# Patient Record
Sex: Male | Born: 1980 | Race: White | Hispanic: No | Marital: Married | Smoking: Current every day smoker
Health system: Southern US, Community
[De-identification: ages and names within clinical notes are randomized; demographics above are authoritative.]

## PROBLEM LIST (undated history)

## (undated) DIAGNOSIS — K219 Gastro-esophageal reflux disease without esophagitis: Secondary | ICD-10-CM

## (undated) DIAGNOSIS — Q159 Congenital malformation of eye, unspecified: Secondary | ICD-10-CM

## (undated) DIAGNOSIS — Z87448 Personal history of other diseases of urinary system: Secondary | ICD-10-CM

## (undated) DIAGNOSIS — H547 Unspecified visual loss: Secondary | ICD-10-CM

## (undated) DIAGNOSIS — H903 Sensorineural hearing loss, bilateral: Secondary | ICD-10-CM

## (undated) DIAGNOSIS — H3552 Pigmentary retinal dystrophy: Secondary | ICD-10-CM

## (undated) DIAGNOSIS — I10 Essential (primary) hypertension: Secondary | ICD-10-CM

## (undated) DIAGNOSIS — Z973 Presence of spectacles and contact lenses: Secondary | ICD-10-CM

## (undated) DIAGNOSIS — Z8669 Personal history of other diseases of the nervous system and sense organs: Secondary | ICD-10-CM

## (undated) HISTORY — PX: CATARACT EXTRACTION, BILATERAL: SHX1313

## (undated) HISTORY — PX: CATARACT EXTRACTION: SUR2

## (undated) HISTORY — PX: HIATAL HERNIA REPAIR: SHX195

## (undated) HISTORY — PX: SINUS SURGERY: SHX187

## (undated) HISTORY — DX: Sensorineural hearing loss, bilateral: H90.3

## (undated) HISTORY — DX: Pigmentary retinal dystrophy: H35.52

---

## 2006-11-23 ENCOUNTER — Ambulatory Visit: Payer: Self-pay | Admitting: Emergency Medicine

## 2006-11-26 ENCOUNTER — Emergency Department: Payer: Self-pay | Admitting: Unknown Physician Specialty

## 2006-11-26 ENCOUNTER — Other Ambulatory Visit: Payer: Self-pay

## 2007-02-09 ENCOUNTER — Emergency Department: Payer: Self-pay | Admitting: Emergency Medicine

## 2007-04-16 ENCOUNTER — Ambulatory Visit: Payer: Self-pay | Admitting: Family Medicine

## 2010-07-28 ENCOUNTER — Ambulatory Visit: Payer: Self-pay | Admitting: Family Medicine

## 2010-11-29 ENCOUNTER — Ambulatory Visit: Payer: Self-pay | Admitting: Internal Medicine

## 2011-01-23 ENCOUNTER — Ambulatory Visit: Payer: Self-pay | Admitting: Ophthalmology

## 2011-02-26 ENCOUNTER — Ambulatory Visit: Payer: Self-pay | Admitting: Ophthalmology

## 2011-03-29 ENCOUNTER — Emergency Department: Payer: Self-pay | Admitting: *Deleted

## 2011-03-29 LAB — COMPREHENSIVE METABOLIC PANEL
Alkaline Phosphatase: 94 U/L (ref 50–136)
Anion Gap: 10 (ref 7–16)
BUN: 15 mg/dL (ref 7–18)
Bilirubin,Total: 0.5 mg/dL (ref 0.2–1.0)
Calcium, Total: 9.1 mg/dL (ref 8.5–10.1)
Chloride: 104 mmol/L (ref 98–107)
EGFR (African American): >60
Glucose: 92 mg/dL (ref 65–99)
Potassium: 3.9 mmol/L (ref 3.5–5.1)
SGOT(AST): 31 U/L (ref 15–37)
SGPT (ALT): 50 U/L
Total Protein: 7.7 g/dL (ref 6.4–8.2)

## 2011-03-29 LAB — URINALYSIS, COMPLETE
Bilirubin,UR: NEGATIVE
Blood: NEGATIVE
Glucose,UR: NEGATIVE mg/dL (ref 0–75)
Ketone: NEGATIVE
Leukocyte Esterase: NEGATIVE
Nitrite: NEGATIVE
Ph: 8 (ref 4.5–8.0)
Protein: NEGATIVE
Specific Gravity: 1.004 (ref 1.003–1.030)
Squamous Epithelial: NONE SEEN
WBC UR: NONE SEEN /HPF (ref 0–5)

## 2011-03-29 LAB — CBC
HCT: 48.4 % (ref 40.0–52.0)
MCH: 30.3 pg (ref 26.0–34.0)
MCV: 89 fL (ref 80–100)
Platelet: 209 10*3/uL (ref 150–440)
RBC: 5.46 10*6/uL (ref 4.40–5.90)

## 2011-04-17 ENCOUNTER — Ambulatory Visit: Payer: Self-pay | Admitting: Internal Medicine

## 2011-04-26 ENCOUNTER — Ambulatory Visit: Payer: Self-pay | Admitting: Internal Medicine

## 2011-08-30 ENCOUNTER — Ambulatory Visit: Payer: Self-pay | Admitting: Internal Medicine

## 2011-11-26 ENCOUNTER — Emergency Department: Payer: Self-pay | Admitting: Emergency Medicine

## 2012-01-30 ENCOUNTER — Inpatient Hospital Stay: Payer: Self-pay | Admitting: Psychiatry

## 2012-01-30 LAB — ETHANOL: Ethanol: <3 mg/dL

## 2012-01-30 LAB — COMPREHENSIVE METABOLIC PANEL
Albumin: 4.1 g/dL (ref 3.4–5.0)
Alkaline Phosphatase: 141 U/L — ABNORMAL HIGH (ref 50–136)
Calcium, Total: 9 mg/dL (ref 8.5–10.1)
Co2: 22 mmol/L (ref 21–32)
Creatinine: 0.92 mg/dL (ref 0.60–1.30)
EGFR (African American): >60
Glucose: 104 mg/dL — ABNORMAL HIGH (ref 65–99)
Osmolality: 274 (ref 275–301)
SGPT (ALT): 41 U/L (ref 12–78)
Sodium: 135 mmol/L — ABNORMAL LOW (ref 136–145)

## 2012-01-30 LAB — CBC
HCT: 49.1 % (ref 40.0–52.0)
HGB: 16.9 g/dL (ref 13.0–18.0)
MCH: 29.8 pg (ref 26.0–34.0)
MCV: 87 fL (ref 80–100)
RBC: 5.68 10*6/uL (ref 4.40–5.90)
RDW: 14 % (ref 11.5–14.5)
WBC: 9.8 10*3/uL (ref 3.8–10.6)

## 2012-01-30 LAB — TSH: Thyroid Stimulating Horm: 3.99 u[IU]/mL

## 2012-01-30 LAB — ACETAMINOPHEN LEVEL: Acetaminophen: <2 ug/mL

## 2012-01-31 LAB — URINALYSIS, COMPLETE
Bilirubin,UR: NEGATIVE
Blood: NEGATIVE
Leukocyte Esterase: NEGATIVE
Nitrite: NEGATIVE
Ph: 6 (ref 4.5–8.0)
Protein: NEGATIVE
RBC,UR: 1 /HPF (ref 0–5)
Squamous Epithelial: NONE SEEN
WBC UR: <1 /HPF (ref 0–5)

## 2012-01-31 LAB — DRUG SCREEN, URINE
Amphetamines, Ur Screen: NEGATIVE (ref ?–1000)
Benzodiazepine, Ur Scrn: POSITIVE (ref ?–200)
Cocaine Metabolite,Ur ~~LOC~~: NEGATIVE (ref ?–300)
MDMA (Ecstasy)Ur Screen: NEGATIVE (ref ?–500)
Opiate, Ur Screen: NEGATIVE (ref ?–300)
Phencyclidine (PCP) Ur S: NEGATIVE (ref ?–25)

## 2012-02-01 ENCOUNTER — Ambulatory Visit: Payer: Self-pay | Admitting: Internal Medicine

## 2012-02-24 ENCOUNTER — Ambulatory Visit: Payer: Self-pay | Admitting: Family Medicine

## 2013-04-24 IMAGING — CT CT ABD-PELV W/ CM
1 of 2 series · 16 of 32 positions shown, 20 images · IV contrast (isovue)
Comparison: none

REASON FOR EXAM: abd pain generalized
COMMENTS:

PROCEDURE:     JUMPER - JUMPER ABDOMEN / PELVIS W  - April 26, 2011 [DATE]
RESULT:     Comparison:  None
TECHNIQUE: Multiple axial images of the abdomen and pelvis were performed
from the lung bases to the pubic symphysis, with p.o. contrast and with 100
mL of Isovue 370 intravenous contrast.

[Series 2: soft tissue · axial · 0.87mm/px · z∈[-200,+245]mm · 16 of 99 slices shown, 20 images]
[im 5/99  soft-tissue]
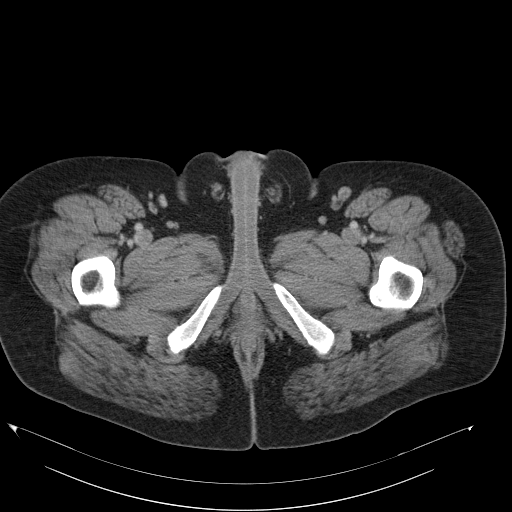
[im 5/99  bone]
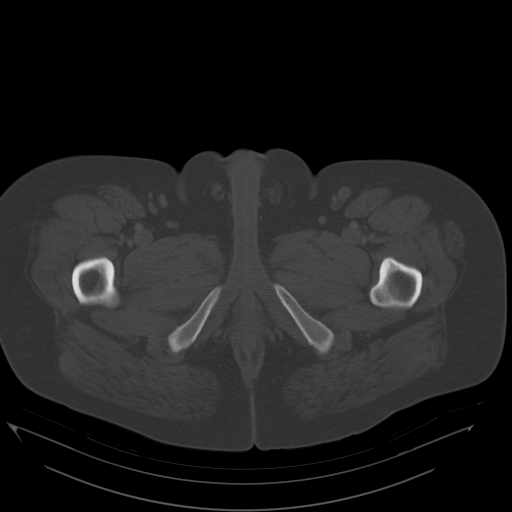
[im 13/99  soft-tissue]
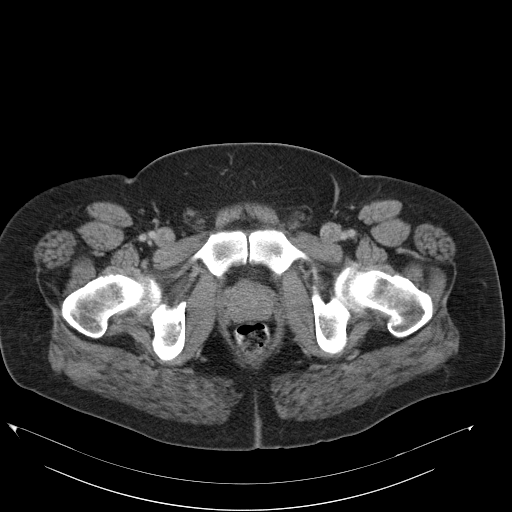
[im 21/99  soft-tissue]
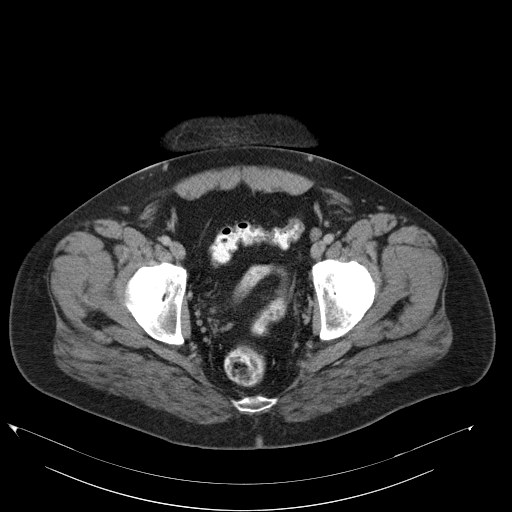
[im 25/99  soft-tissue]
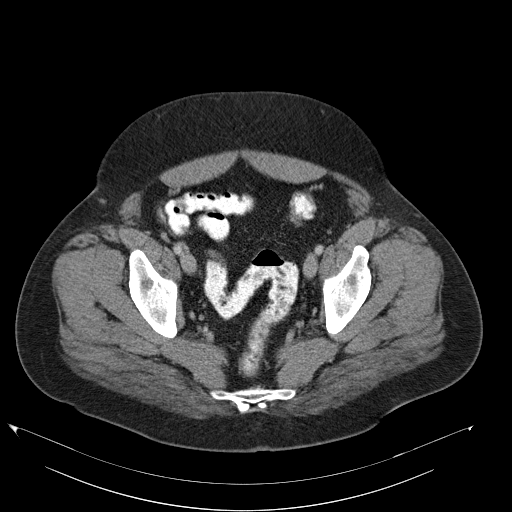
[im 33/99  soft-tissue]
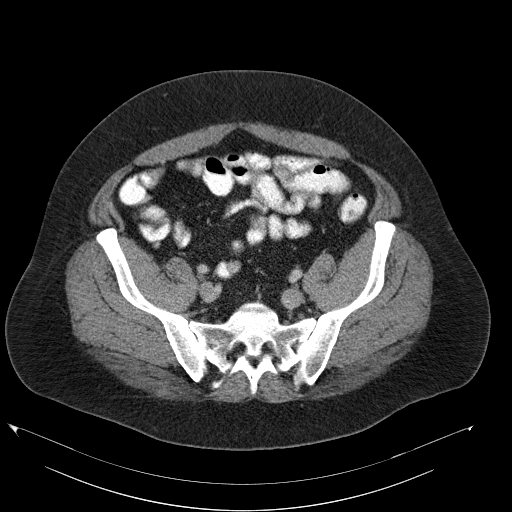
[im 41/99  soft-tissue]
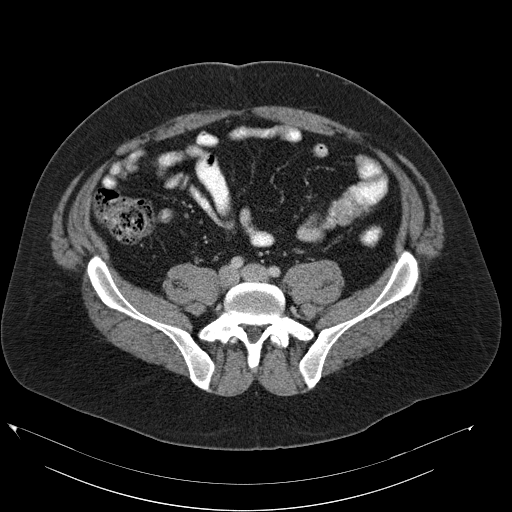
[im 45/99  soft-tissue]
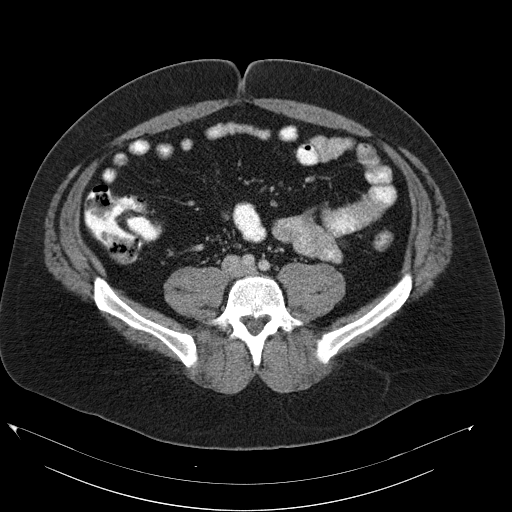
[im 54/99  soft-tissue]
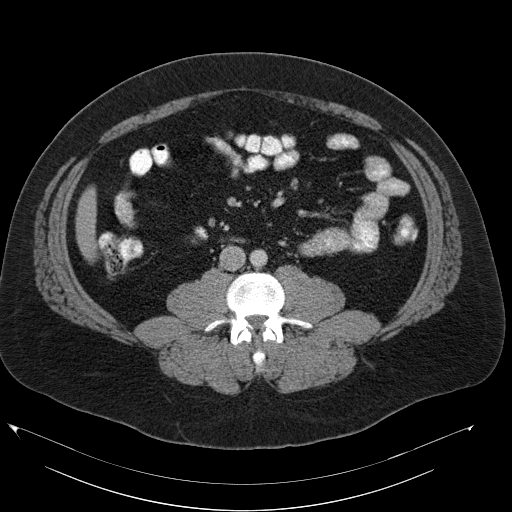
[im 58/99  soft-tissue]
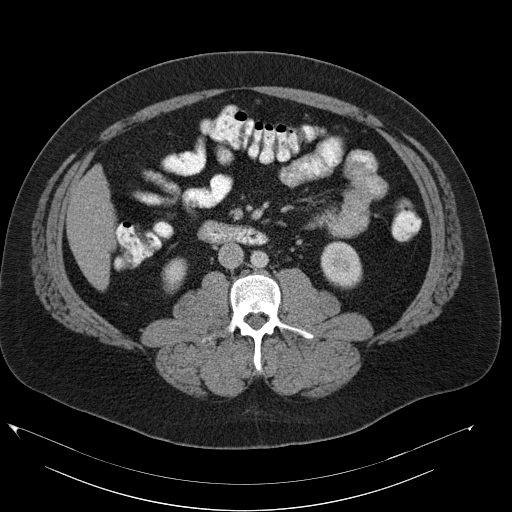
[im 58/99  bone]
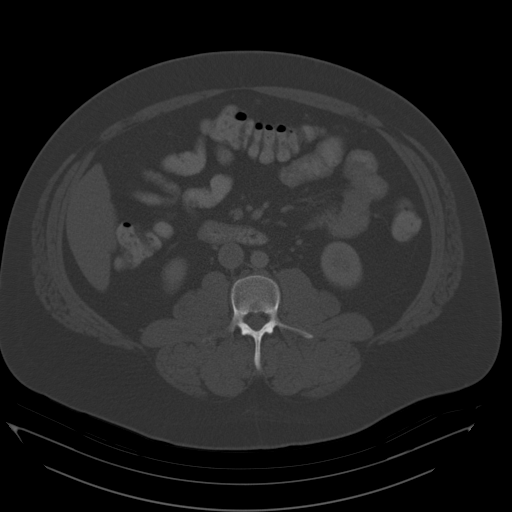
[im 66/99  soft-tissue]
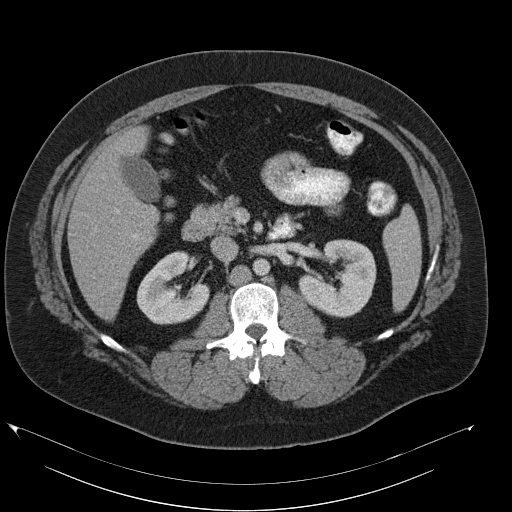
[im 74/99  soft-tissue]
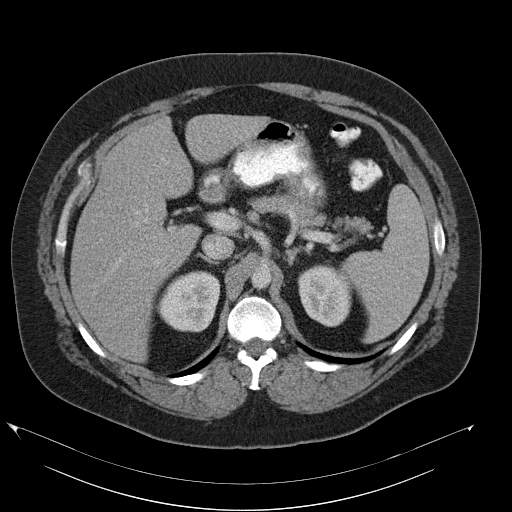
[im 78/99  soft-tissue]
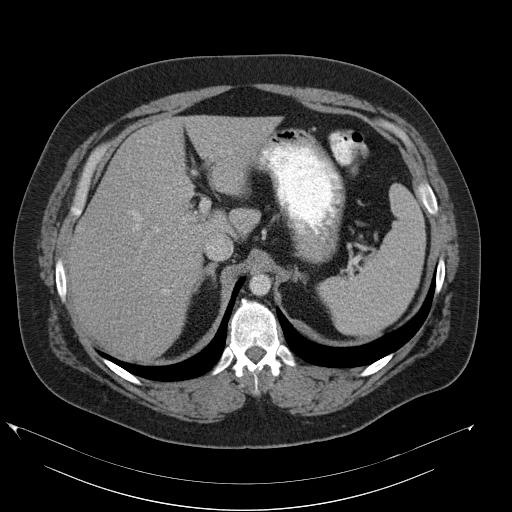
[im 82/99  lung]
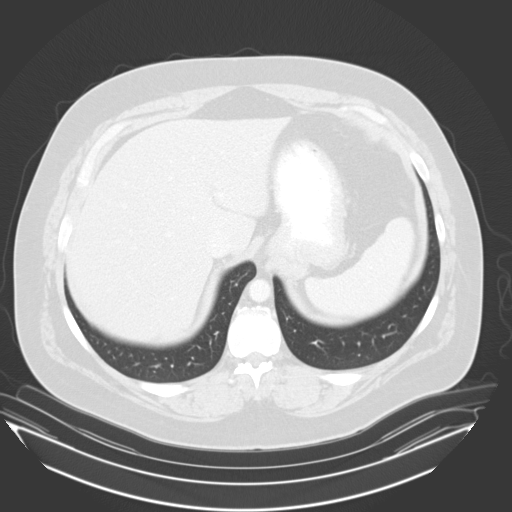
[im 86/99  soft-tissue]
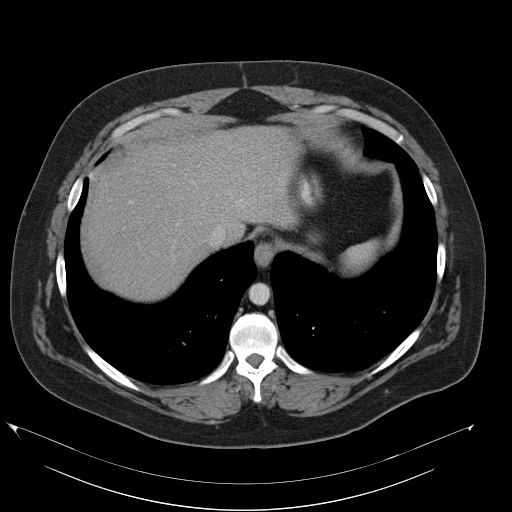
[im 86/99  lung]
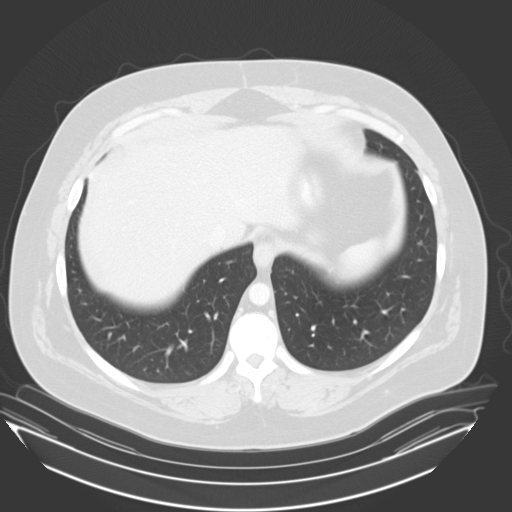
[im 90/99  lung]
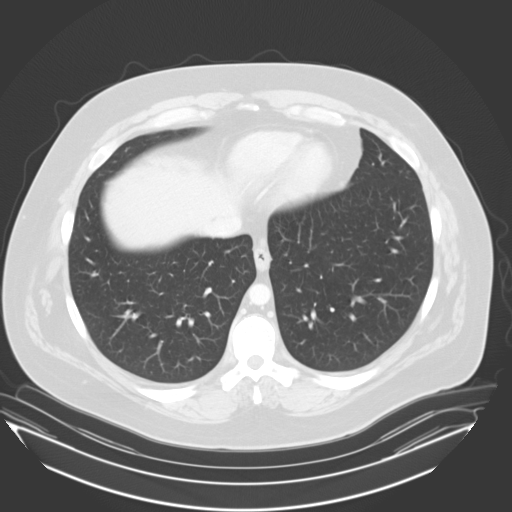
[im 94/99  soft-tissue]
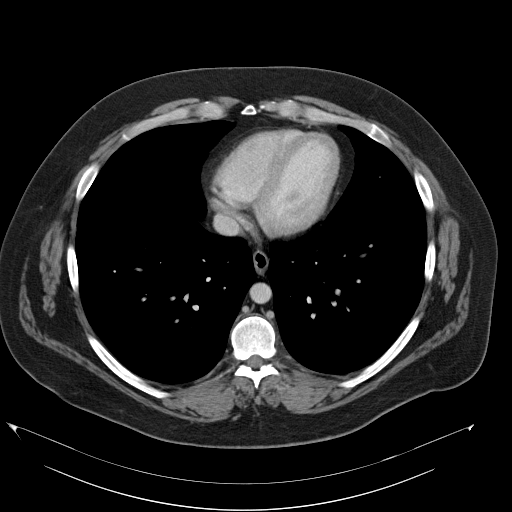
[im 94/99  lung]
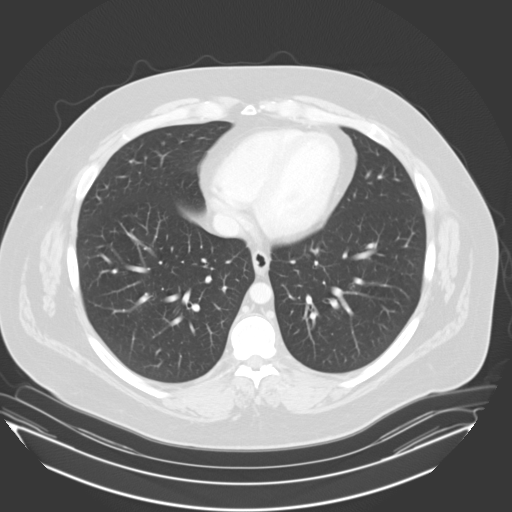

[16 of 32 positions shown; findings below may reference images not displayed]

FINDINGS: The liver, gallbladder, spleen, and pancreas are unremarkable. The adrenals
are unremarkable. The kidneys enhance normally.

The small and large bowel are normal in caliber. The appendix is normal.

No aggressive lytic or sclerotic osseous lesions are identified.
IMPRESSION: No acute findings in the abdomen or pelvis.

## 2013-11-24 IMAGING — US US EXTREM LOW VENOUS*L*
1 series · 14 of 24 positions shown · non-contrast
Comparison: none

REASON FOR EXAM: pain, edema, and ecchymosis 2nd to blunt trauma 5 days
ago
COMMENTS:   LMP: (Male)

[Series 1: us extrem low venous*left* · 14 of 35 slices shown]
[im 1/35]
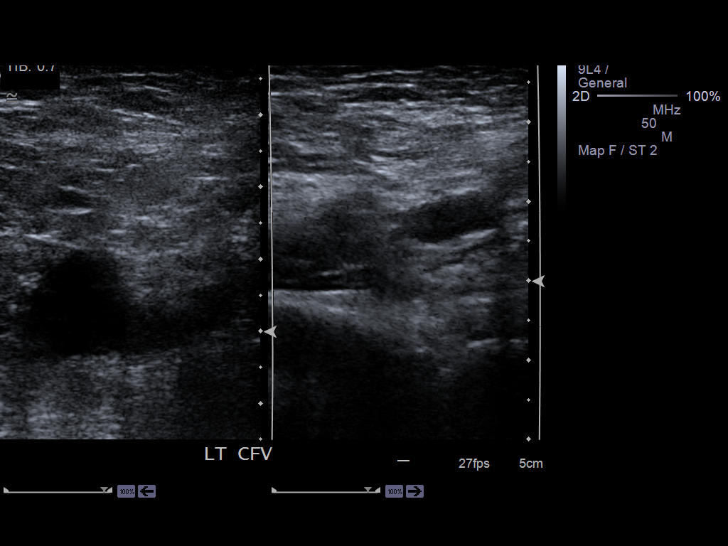
[im 3/35]
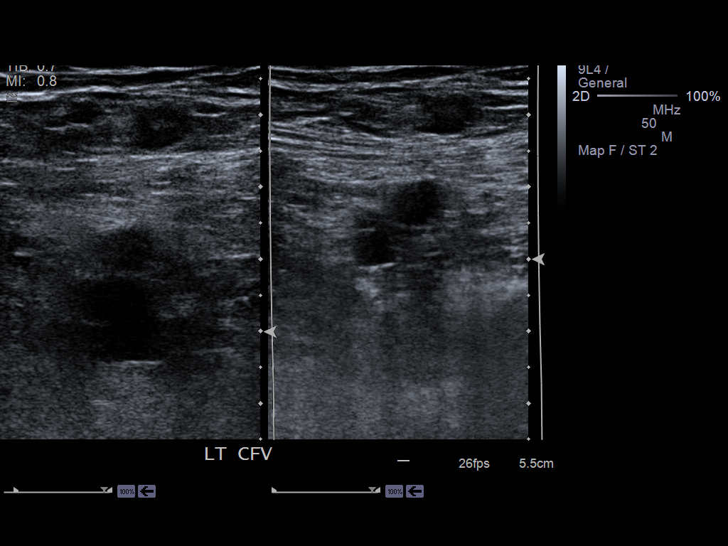
[im 6/35]
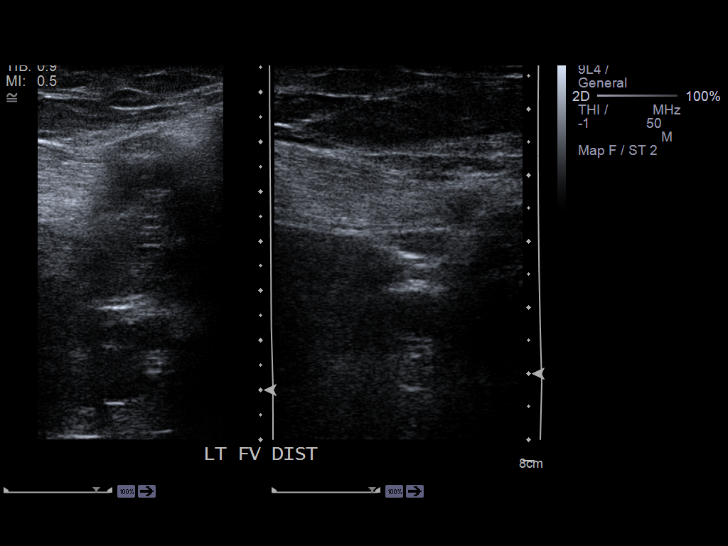
[im 9/35]
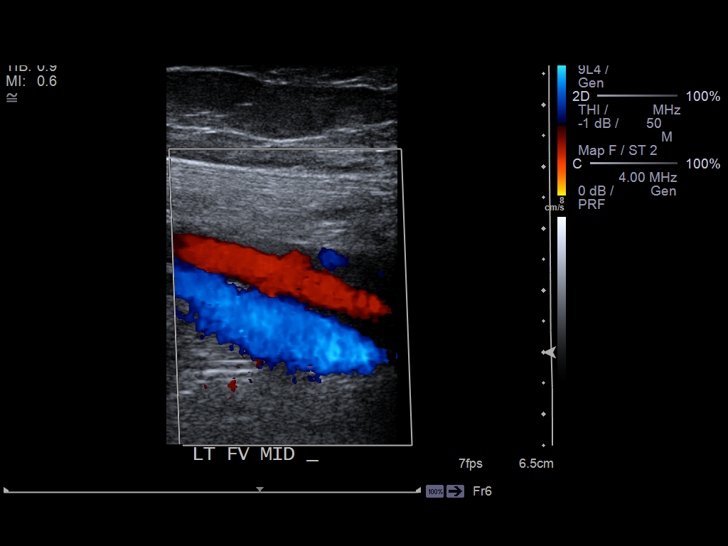
[im 11/35]
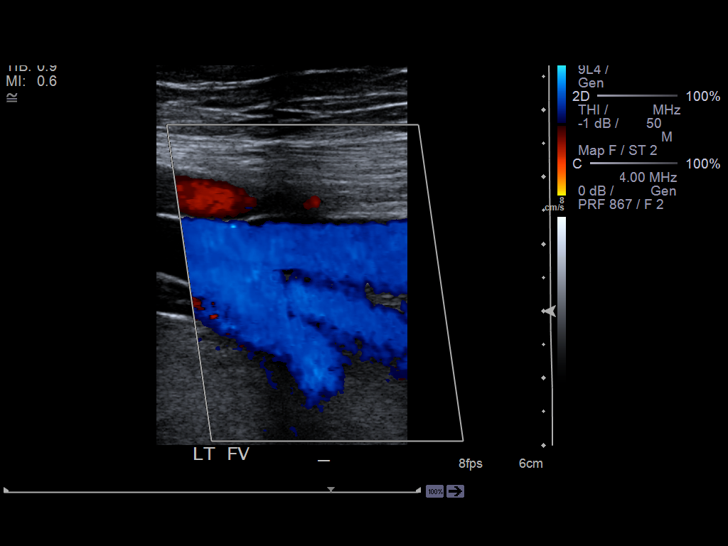
[im 14/35]
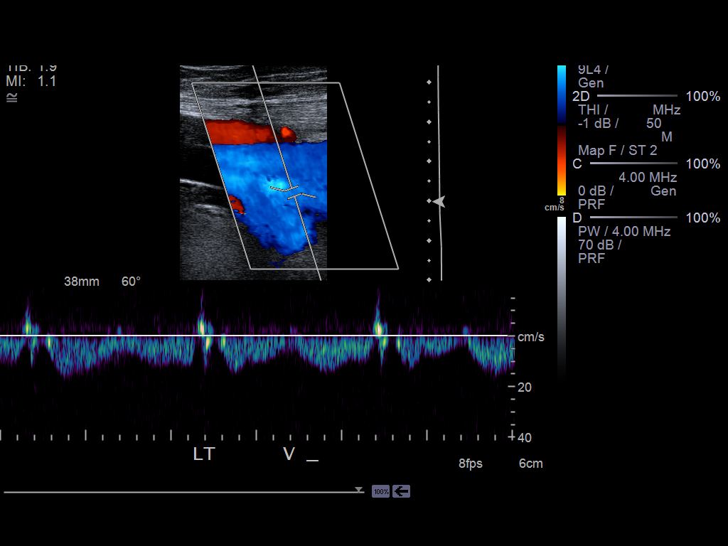
[im 17/35]
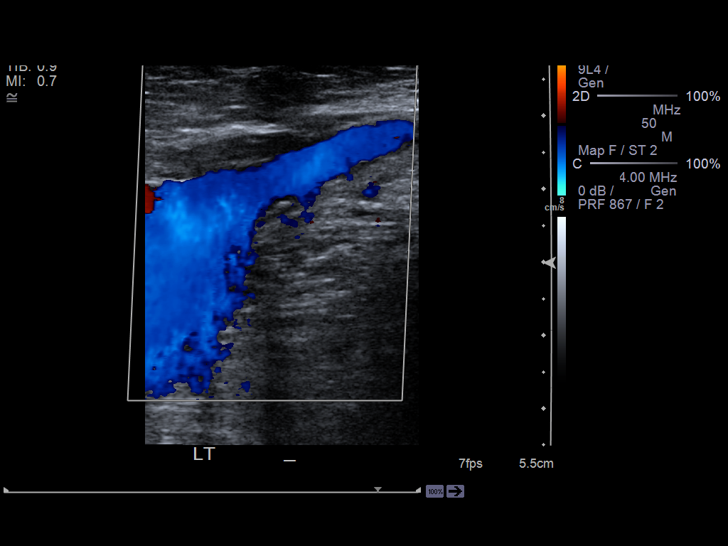
[im 18/35]
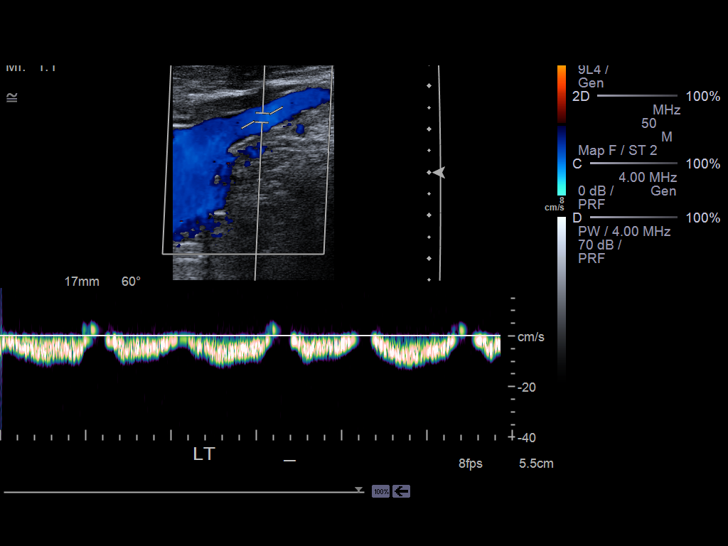
[im 21/35]
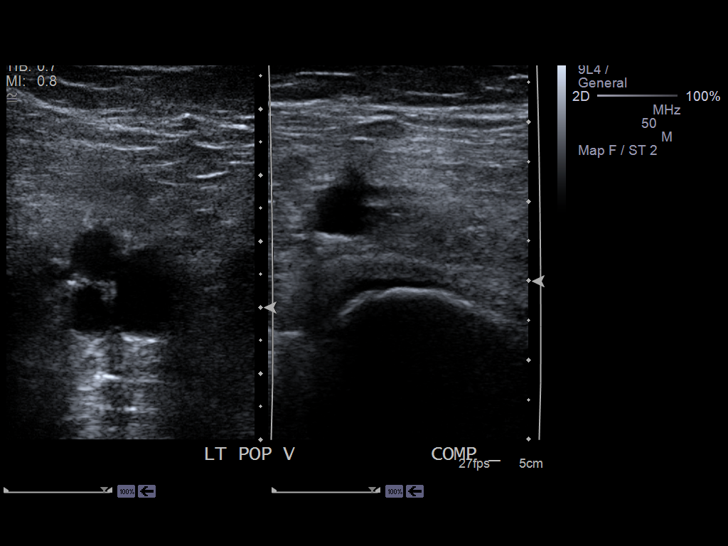
[im 24/35]
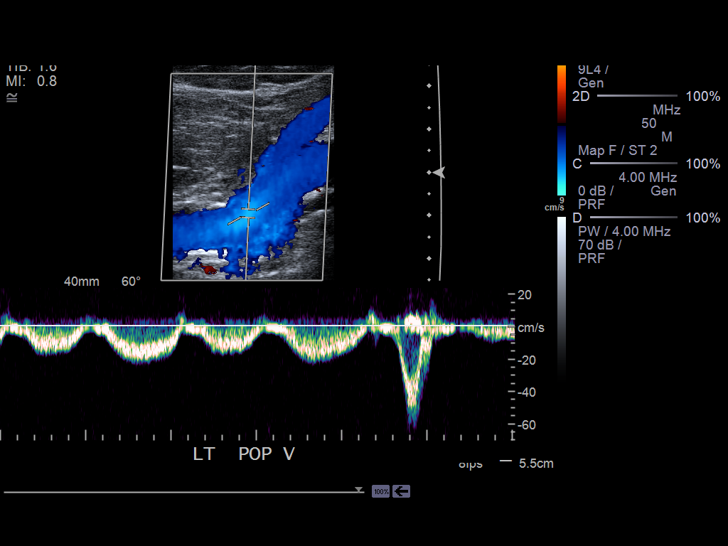
[im 27/35]
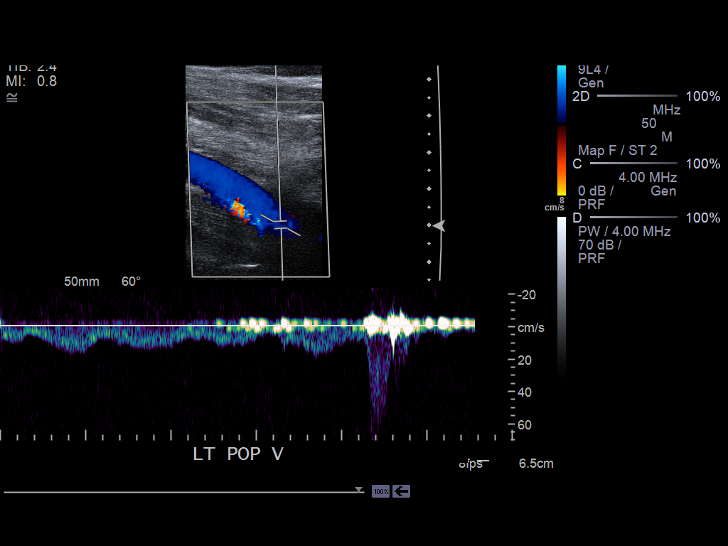
[im 29/35]
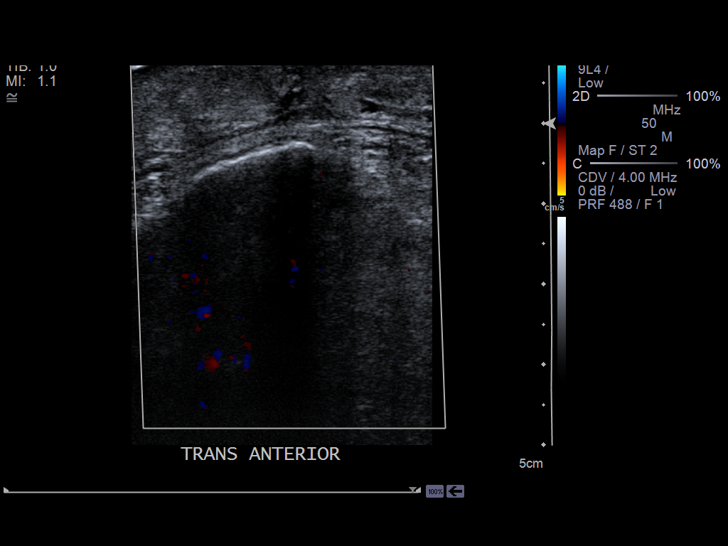
[im 32/35]
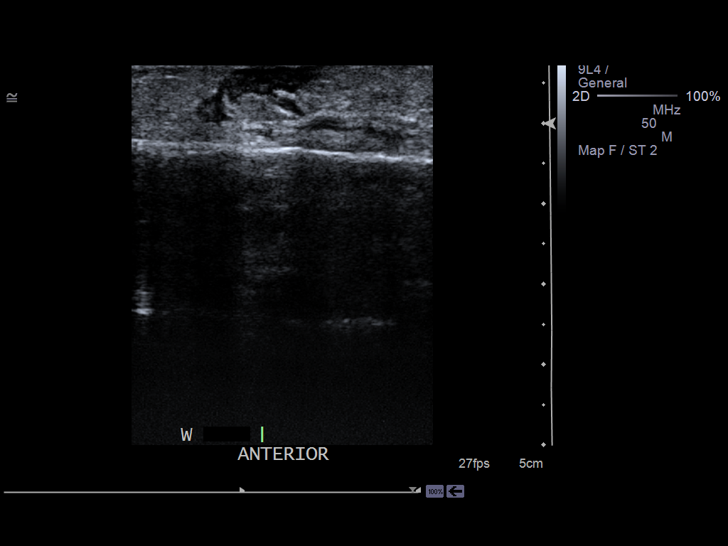
[im 35/35]
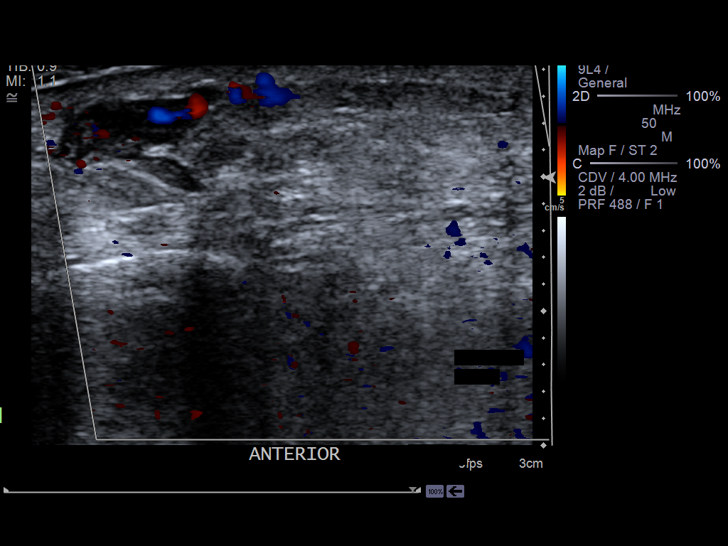

[14 of 24 positions shown; findings below may reference images not displayed]

PROCEDURE:     US  - US DOPPLER LOW EXTR LEFT  - November 26, 2011  [DATE]

RESULT:     Doppler interrogation of the deep venous system of the left leg
is performed from the common femoral vein through the popliteal vein.
Compression grayscale, color Doppler and spectral Doppler images are
utilized. The appearance is normal.

At the site of concern within the superficial soft tissues there is evidence
of superficial varicosities with some nonocclusive thrombus.
IMPRESSION: 1. No evidence of left lower extremity deep vein thrombosis.

[REDACTED]

## 2014-02-22 IMAGING — CR RIGHT HAND - COMPLETE 3+ VIEW
1 series · 3 of 3 positions shown · non-contrast
Comparison: none

REASON FOR EXAM: injury
COMMENTS:

PROCEDURE:     MDR - MDR HAND RT COMP W/OBLIQUES  - February 24, 2012  [DATE]
RESULT:     Comparison:  None

[Series 1: pa · 0.17mm/px · 3 of 3 slices shown]
[im 1/3]
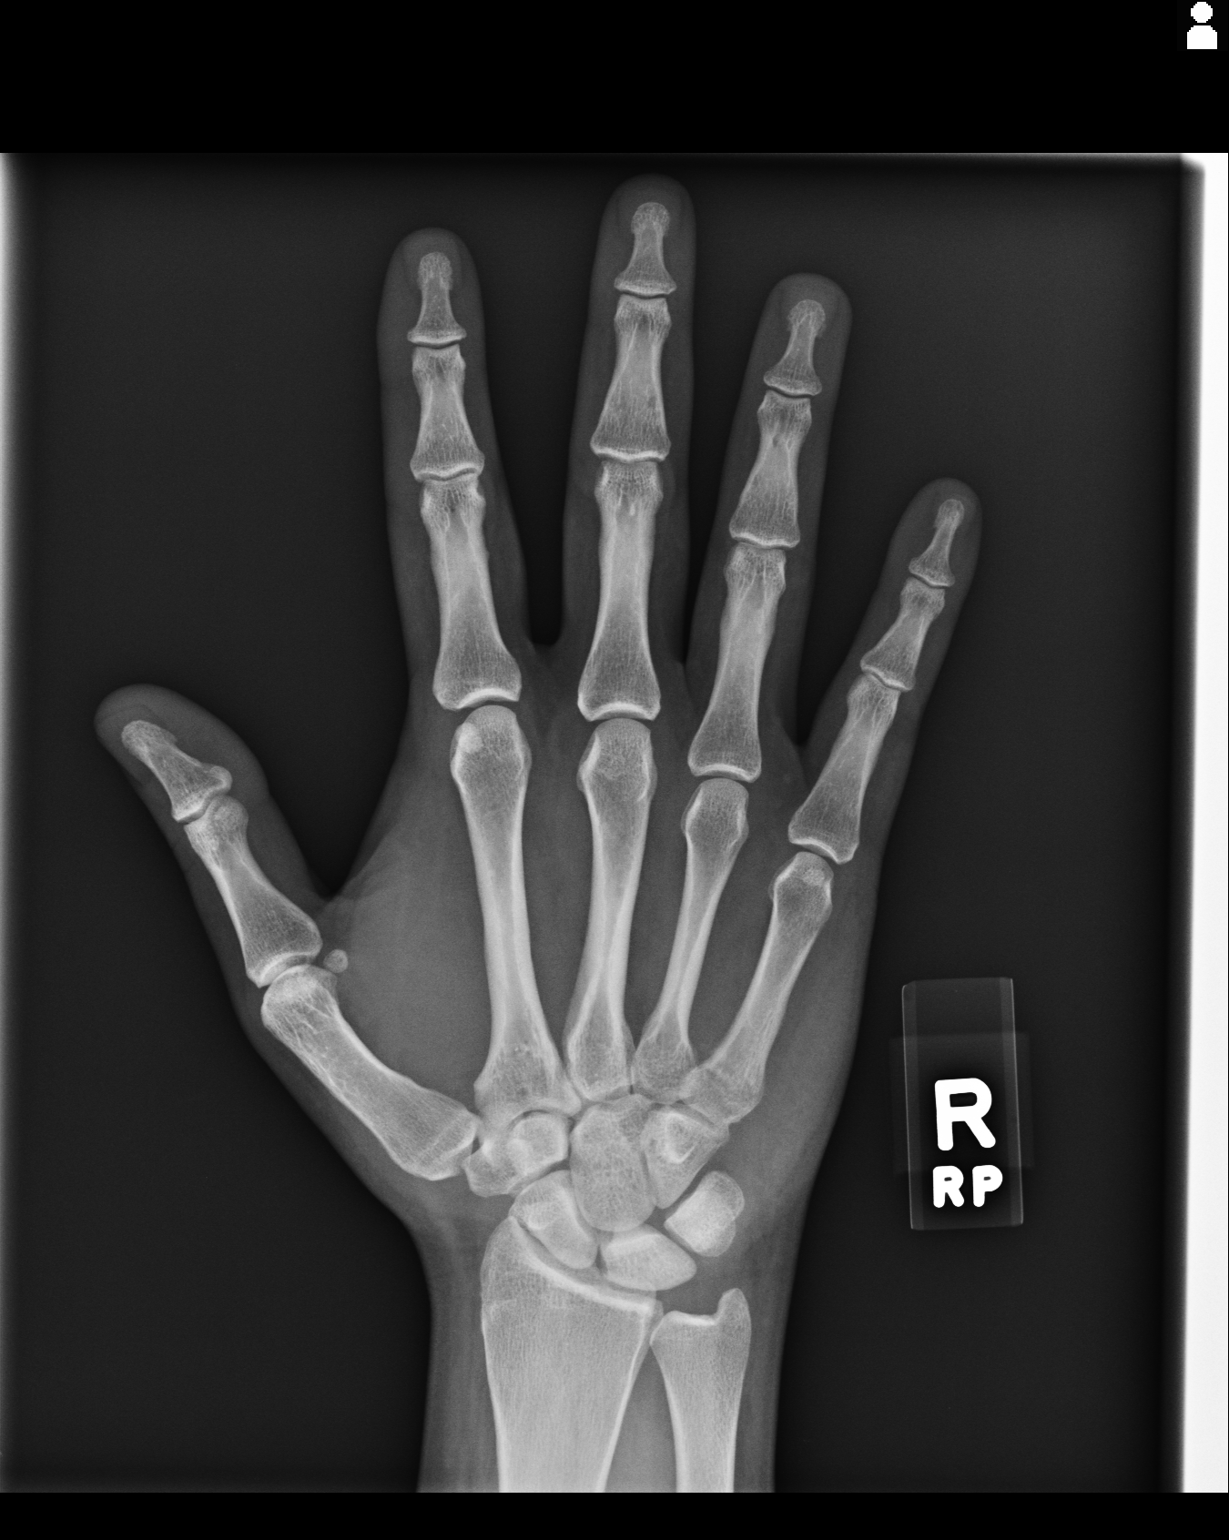
[im 2/3]
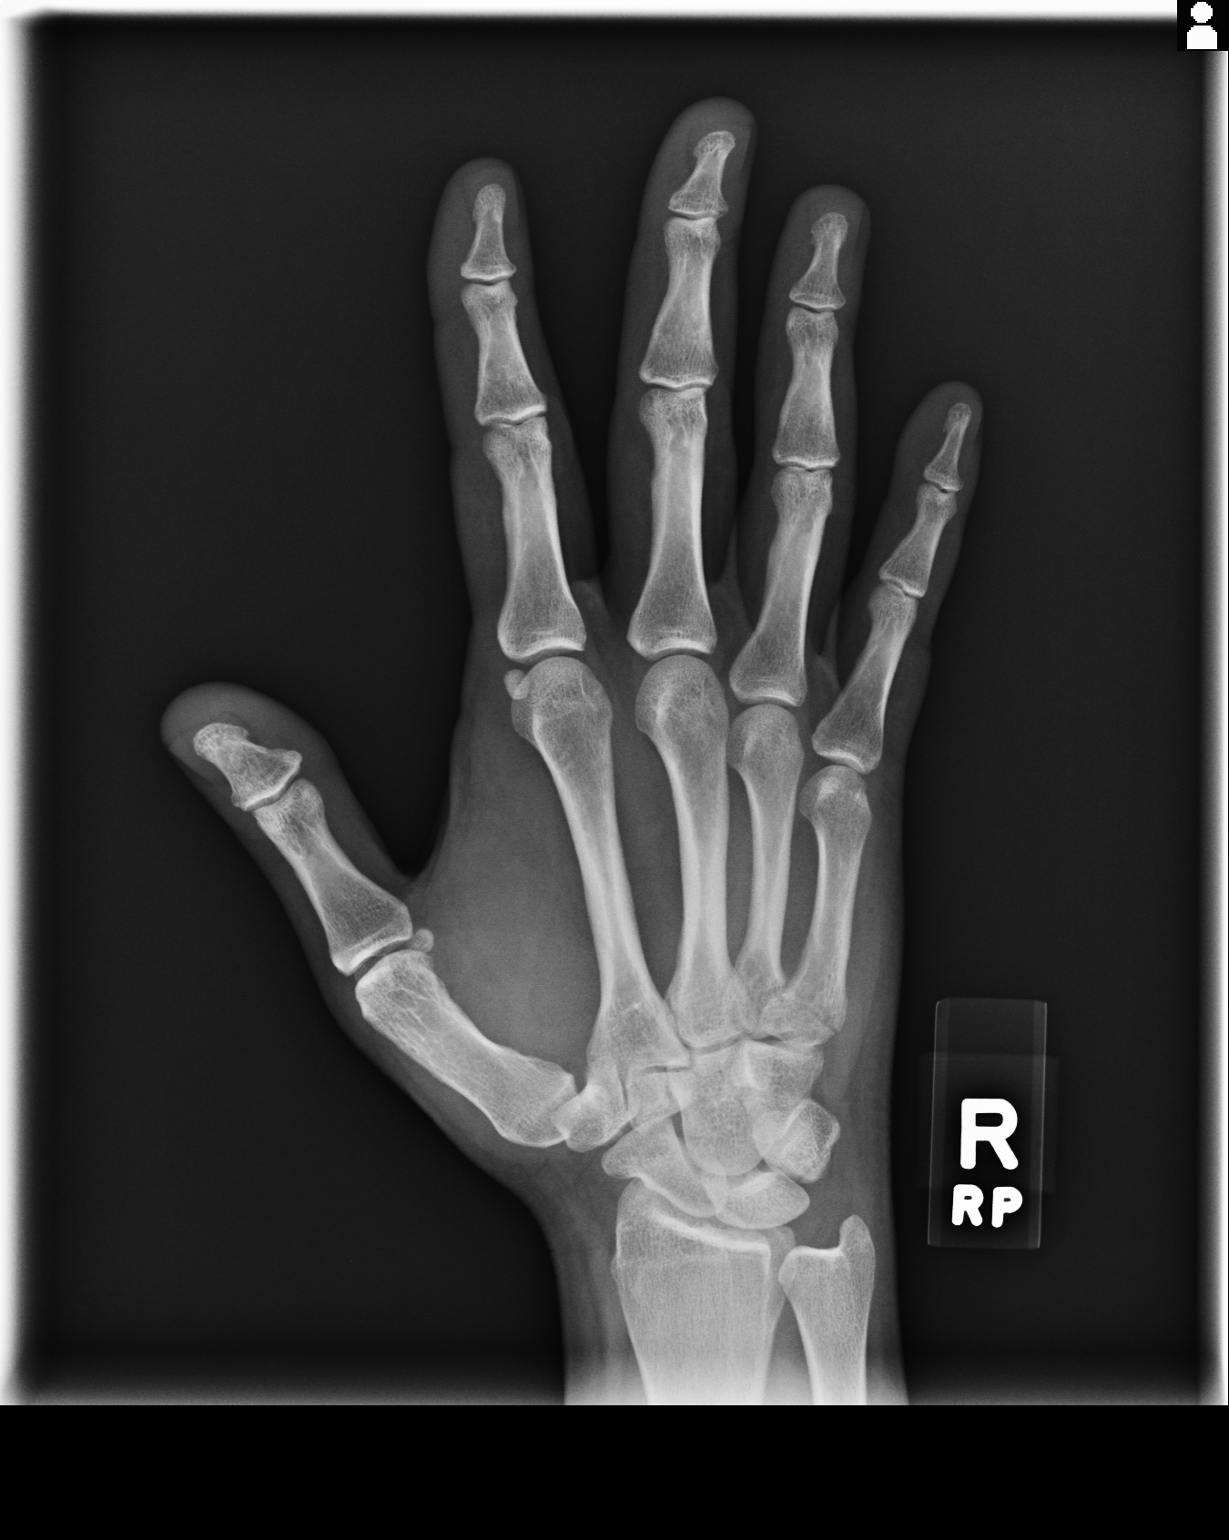
[im 3/3]
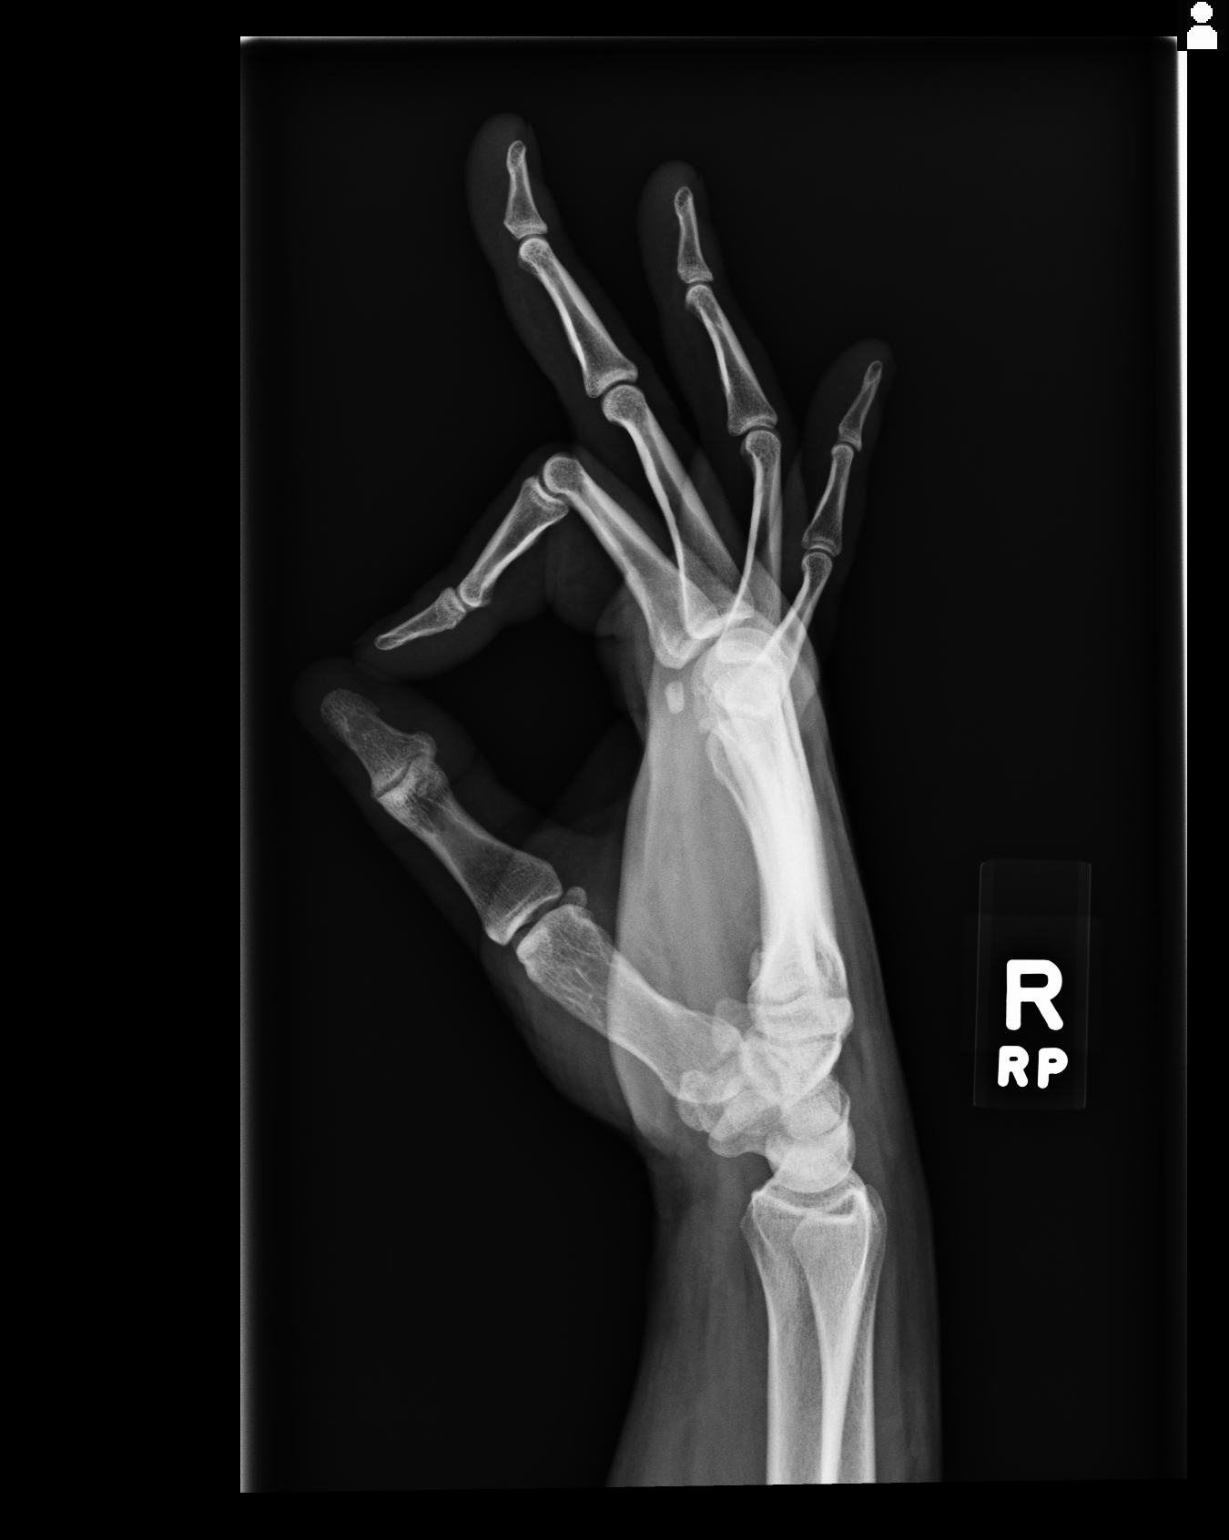

[3 of 3 positions shown; findings below may reference images not displayed]

FINDINGS: AP, oblique, and lateral views of the right hand demonstrates a nondisplaced
fracture of the base of the fifth metacarpal. There is normal bone
mineralization. There are no erosive changes. The joint spaces are
maintained. There is no soft tissue swelling.
IMPRESSION: Nondisplaced fracture at the base of the fifth metacarpal.

[REDACTED]
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
## 2014-05-31 NOTE — Discharge Summary (Signed)
PATIENT NAME:  Evan Reed, Firmin D MR#:  [id number] DATE OF BIRTH:  [birth year]  HOSPITAL COURSE:  See dictated history and physical for details.  The patient was just admitted to the hospital last night after presenting with complaints of recent suicidal statements. In the hospital, he was very aggressive and out of control in his behavior last night, throwing himself against the wall, punching a wall, making a great deal of noise. Apparently did not actually assault anyone else. He required some IM medication to get him to calm down. Today, he has been restless and demanding to see the doctor. He has not made any specific threats to anyone else and denies suicidal ideation. He does not show signs of psychosis. He was able to be more cooperative with medication today.   The patient gives a history of impulsivity and anger problems, but denies any acute suicidal or homicidal ideation. His wife was contacted and she also does not think that he is acutely suicidal or dangerous to anyone else and is requesting that he be discharged. The patient was evaluated and at this time, it does not seem like he is likely to benefit from inpatient treatment. He has been given psychoeducation about impulse control problems and possible medication options. He is not to change currently his dose of citalopram 40 mg a day. He will be given an [facility] or other mental health clinic locally. No medications prescribed at discharge.   LABORATORY AND DIAGNOSTIC DATA:  Urinalysis unremarkable. Drug screen positive for cannabis and benzodiazepines. TSH normal. Salicylates slightly high at 3.8. CBC normal. Alcohol undetected. Chemistry shows a slightly low sodium at 135, elevated BUN at 22, elevated glucose at 104.   DISCHARGE MEDICATIONS:  Citalopram 40 mg a day.  He used his own medication.   MENTAL STATUS EXAMINATION:  Casually dressed, neatly groomed man who looks his stated age. Eye contact normal.  Psychomotor activity normal. Speech normal rate, tone and volume.  Affect euthymic, a little bit blunted. Mood stated as being okay. Thoughts are lucid with no sign of loosening of associations or delusions. Denies current auditory or visual hallucinations. Denies any suicidal or homicidal ideation. Judgment and insight a little bit impaired, but not severely so. Alert and oriented x 4. There is some indication that he may have a slightly low below-average intelligence at baseline.   DISPOSITION:  Discharged back home with his family. Followup to be arranged with [facility]  DIAGNOSES, PRINCIPAL AND PRIMARY: AXIS I:  Impulse control disorder, not otherwise specified.   SECONDARY DIAGNOSES: AXIS I:  Cannabis abuse.  AXIS II:  Deferred.  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  AXIS IV:  Severe from financial and medical difficulties.  AXIS V:  Functioning at time of discharge 55.     ____________________________ Audery AmelJohn T. Demarquis Osley, MD jtc:ct D: 01/31/2012 15:24:00 ET T: 02/02/2012 14:40:52 ET JOB#: [id number]  cc: Audery AmelJohn T. Brynnlee Cumpian, MD, <Dictator> Audery AmelJOHN T Gorden Stthomas MD ELECTRONICALLY SIGNED 02/03/2012 13:08

## 2014-06-05 NOTE — Op Note (Signed)
PATIENT NAME:  [name], Evan D MR#:  Reed DATE OF BIRTH:  [birth year]  DATE OF PROCEDURE:  02/26/2011  PREOPERATIVE DIAGNOSIS: Visually significant cataract of the right eye.   POSTOPERATIVE DIAGNOSIS: Visually significant cataract of the right eye.   OPERATIVE PROCEDURE: Cataract extraction by phacoemulsification with implant of intraocular lens to the right eye.   SURGEON: Galen ManilaWilliam Breckon Reeves, MD  ANESTHESIA:  1. Managed anesthesia care.  2. 50-50 mixture of 0.75% bupivacaine and 4% Xylocaine given as a retrobulbar block.   COMPLICATIONS: None.   TECHNIQUE:  Irrigation/aspiration of the entire lens. No phacoemulsification was used.   DESCRIPTION OF PROCEDURE: The patient was examined and consented for this procedure in the preoperative holding area and then brought back to the Operating Room where the anesthesia team employed managed anesthesia care.  3.5 milliliters of the aforementioned mixture were placed in the right orbit on an Atkinson needle without complication. The right eye was then prepped and draped in the usual sterile ophthalmic fashion. A lid speculum was placed. The side-port blade was used to create a paracentesis and the anterior chamber was filled with viscoelastic. The keratome was used to create a near clear corneal incision. The continuous curvilinear capsulorrhexis was performed with a cystotome followed by the capsulorrhexis forceps. Hydrodissection and hydrodelineation were carried out with BSS on a blunt cannula. The lens was removed in an irrigation/aspiration technique. The remaining cortical material was removed with the irrigation-aspiration handpiece. The capsular bag was inflated with viscoelastic and the Alcon SN60WF 17.5-diopter lens, serial number [id number] was placed in the capsular bag without complication. The remaining viscoelastic was removed from the eye with the irrigation-aspiration handpiece. The wounds were hydrated. The anterior chamber  was flushed with Miostat. The eye was inflated to a physiologic pressure and the wounds were found to be watertight.   The eye was dressed with Vigamox followed by Maxitrol ointment and a protective shield was placed. The patient will follow-up with me in one day.   ____________________________ Jerilee FieldWilliam L. Sharlize Hoar, MD wlp:drc D: 02/26/2011 12:10:50 ET T: 02/26/2011 12:21:42 ET JOB#: [id number]  cc: Olivine Hiers L. Recie Cirrincione, MD, <Dictator> Jerilee FieldWILLIAM L Zachary Lovins MD ELECTRONICALLY SIGNED 03/26/2011 12:47

## 2015-09-11 ENCOUNTER — Encounter: Payer: Self-pay | Admitting: Emergency Medicine

## 2015-09-11 ENCOUNTER — Emergency Department
Admission: EM | Admit: 2015-09-11 | Discharge: 2015-09-11 | Disposition: A | Discharge status: Home / Self Care | Payer: Medicare Other | Attending: Emergency Medicine | Admitting: Emergency Medicine

## 2015-09-11 DIAGNOSIS — K0889 Other specified disorders of teeth and supporting structures: Secondary | ICD-10-CM

## 2015-09-11 DIAGNOSIS — K029 Dental caries, unspecified: Secondary | ICD-10-CM | POA: Insufficient documentation

## 2015-09-11 MED ORDER — AMOXICILLIN 500 MG PO TABS: 500.0000 mg | ORAL_TABLET | Freq: Two times a day (BID) | ORAL | 0 refills | Status: DC

## 2015-09-11 MED ORDER — OXYCODONE-ACETAMINOPHEN 5-325 MG PO TABS: 1.0000 | ORAL_TABLET | ORAL | 0 refills | Status: DC | PRN

## 2015-09-11 MED ORDER — MELOXICAM 15 MG PO TABS: 15.0000 mg | ORAL_TABLET | Freq: Every day | ORAL | 0 refills | Status: DC

## 2015-09-11 MED ORDER — OXYCODONE-ACETAMINOPHEN 5-325 MG PO TABS
ORAL_TABLET | ORAL | Status: AC
  Filled 2015-09-11: qty 1

## 2015-09-11 MED ORDER — OXYCODONE-ACETAMINOPHEN 5-325 MG PO TABS
1.0000 | ORAL_TABLET | Freq: Once | ORAL | Status: AC
  Administered 2015-09-11: 1 via ORAL

## 2015-09-11 NOTE — Discharge Instructions (Signed)
OPTIONS FOR DENTAL FOLLOW UP CARE ° °[place] Department of Health and Human Services - Local Safety Net Dental Clinics °http://www.ncdhhs.gov/dph/oralhealth/services/safetynetclinics.htm °  °[facility] (336-562-3123) ° °[facility] (919-933-9087) ° °[facility] (919-663-1744 ext [phone]) ° °[facility] (336-570-6415) ° °[facility] (919-968-2025) °This clinic caters to the indigent population and is on a lottery system. °Location: °[facility], [address], [place] °Clinic Hours: °Wednesdays from 6pm - 9pm, patients seen by a lottery system. °For dates, call or go to www.med.[facility].edu/[facility]/patients/Dental-[facility] °Services: °Cleanings, fillings and simple extractions. °Payment Options: °DENTAL WORK IS FREE OF CHARGE. Bring proof of income or support. °Best way to get seen: °Arrive at 5:15 pm - this is a lottery, NOT first come/first serve, so arriving earlier will not increase your chances of being seen. °  °  °[facility] °919-537-3737 °Select option 1 for emergencies °  °Location: °[facility], [address], [place] °Clinic Hours: °No walk-ins accepted - call the day before to schedule an appointment. °Check in times are 9:30 am and 1:30 pm. °Services: °Simple extractions, temporary fillings, pulpectomy/pulp debridement, uncomplicated abscess drainage. °Payment Options: °PAYMENT IS DUE AT THE TIME OF SERVICE.  Fee is usually $100-200, additional surgical procedures (e.g. abscess drainage) may be extra. °Cash, checks, Visa/MasterCard accepted.  Can file Medicaid if patient is covered for dental - patient should call case worker to check. °No discount for [facility] patients. °Best way to get seen: °MUST call the day before and get onto the schedule. Can usually be seen the next 1-2 days. No walk-ins accepted. °  °  °[facility] °[phone] °   °Location: °[facility], [address], [place] °Clinic Hours: °M, W, Th, F 8am or 1:30pm, Tues 9a or 1:30 - first come/first served. °Services: °Simple extractions, temporary fillings, uncomplicated abscess drainage.  You do not need to be an [place] resident. °Payment Options: °PAYMENT IS DUE AT THE TIME OF SERVICE. °Dental insurance, otherwise sliding scale - bring proof of income or support. °Depending on income and treatment needed, cost is usually $50-200. °Best way to get seen: °Arrive early as it is first come/first served. °  °  °[facility] °919-542-1641 °  °Location: °[address] °Clinic Hours: °Mon-Thu 8a-5p °Services: °Most basic dental services including extractions and fillings. °Payment Options: °PAYMENT IS DUE AT THE TIME OF SERVICE. °Sliding scale, up to 50% off - bring proof if income or support. °Medicaid with dental option accepted. °Best way to get seen: °Call to schedule an appointment, can usually be seen within 2 weeks OR they will try to see walk-ins - show up at 8a or 2p (you may have to wait). °  °  °[facility] °919-245-2435 °[place] RESIDENTS ONLY °  °Location: °[facility], [address], [place], [place] [address] °Clinic Hours: By appointment only. °Monday - Thursday 8am-5pm, Friday 8am-12pm °Services: Cleanings, fillings, extractions. °Payment Options: °PAYMENT IS DUE AT THE TIME OF SERVICE. °Cash, Visa or MasterCard. Sliding scale - $30 minimum per service. °Best way to get seen: °Come in to office, complete packet and make an appointment - need proof of income °or support monies for each household member and proof of [place] residence. °Usually takes about a month to get in. °  °  °[facility] °[phone] °  °Location: °[address]   [place] °Clinic Hours: Walk-in Urgent Care Dental Services are offered Monday-Friday  mornings only. °The numbers of emergencies accepted daily is limited to the number of °providers available. °Maximum 15 - Mondays, Wednesdays & Thursdays °Maximum 10 - Tuesdays & Fridays °Services: °You do not need to be a [place] resident to be seen for a dental emergency. °Emergencies are defined as pain, swelling, abnormal bleeding, or dental trauma. Walkins will receive x-rays if needed. °NOTE: Dental cleaning is not an emergency. °Payment Options: °PAYMENT IS DUE AT THE TIME OF SERVICE. °Minimum co-pay is $40.00 for uninsured patients. °Minimum co-pay is $3.00 for Medicaid with dental coverage. °Dental Insurance is accepted and must be presented at time of visit. °Medicare does not cover dental. °Forms of payment: Cash, credit card, checks. °Best way to get seen: °If not previously registered with the clinic, walk-in dental registration begins at 7:15 am and is on a first come/first serve basis. °If previously registered with the clinic, call to make an appointment. °  °  °[facility] °919-776-4359 °[place] RESIDENTS ONLY °  °Location: °[address], [place], [place] °Clinic Hours: °Mon-Thu 10a-2p °Services: Extractions only! °Payment Options: °FREE (donations accepted) - bring proof of income or support °Best way to get seen: °Call and schedule an appointment OR come at 8am on the 1st Monday of every month (except for holidays) when it is first come/first served. °  °  °[facility] °[phone] °  °Location: °[address],  °Clinic Hours: °Friday mornings °Services, Payment Options, Best way to get seen: °Call for info °

## 2015-09-11 NOTE — ED Provider Notes (Signed)
[facility] Emergency Department Provider Note  ____________________________________________  Time seen: Approximately 2:42 PM  I have reviewed the triage vital signs and the nursing notes.   HISTORY  Chief Complaint Dental Pain  Dictation was completed a month after patient visit. Unsure of which is tooth actually is hurting at the time of this dictation.  HPI Evan Reed is a 35 y.o. male who presents for evaluation of dental pain. Patient states that pain has been going on for some time and desires referral to local dentist for evaluation.   History reviewed. No pertinent past medical history.  There are no active problems to display for this patient.   History reviewed. No pertinent surgical history.  Prior to Admission medications   Medication Sig Start Date End Date Taking? Authorizing Provider  amoxicillin (AMOXIL) 500 MG tablet Take 1 tablet (500 mg total) by mouth 2 (two) times daily. 09/11/15   Charmayne Sheer Beers, PA-C  meloxicam (MOBIC) 15 MG tablet Take 1 tablet (15 mg total) by mouth daily. 09/11/15   Evangeline Dakin, PA-C  oxyCODONE-acetaminophen (ROXICET) 5-325 MG tablet Take 1-2 tablets by mouth every 4 (four) hours as needed for severe pain. 09/11/15   Evangeline Dakin, PA-C    Allergies Review of patient's allergies indicates no known allergies.  No family history on file.  Social History Social History  Substance Use Topics  . Smoking status: Never Smoker  . Smokeless tobacco: Never Used  . Alcohol use Not on file    Review of Systems Constitutional: No fever/chills ENT: Positive for dental pain. Cardiovascular: Denies chest pain. Respiratory: Denies shortness of breath. Neurological: Negative for headaches, focal weakness or numbness.  10-point ROS otherwise negative.  ____________________________________________   PHYSICAL EXAM:  VITAL SIGNS: ED Triage Vitals  Enc Vitals Group     BP 09/11/15 1429 (!)  144/90     Pulse Rate 09/11/15 1429 74     Resp 09/11/15 1429 18     Temp 09/11/15 1429 98.1 F (36.7 C)     Temp Source 09/11/15 1429 Oral     SpO2 09/11/15 1429 97 %     Weight 09/11/15 1429 200 lb (90.7 kg)     Height --      Head Circumference --      Peak Flow --      Pain Score 09/11/15 1433 6     Pain Loc --      Pain Edu? --      Excl. in GC? --     Constitutional: Alert and oriented. Well appearing and in no acute distress. Mouth/Throat: Mucous membranes are moist.  Oropharynx non-erythematous.Obvious dental caries. Neck: No stridor.   Cardiovascular: Normal rate, regular rhythm. Grossly normal heart sounds.  Good peripheral circulation. Respiratory: Normal respiratory effort.  No retractions. Lungs CTAB. Skin:  Skin is warm, dry and intact. No rash noted. Psychiatric: Mood and affect are normal. Speech and behavior are normal.  ____________________________________________   LABS (all labs ordered are listed, but only abnormal results are displayed)  Labs Reviewed - No data to display ____________________________________________  EKG   ____________________________________________  RADIOLOGY   ____________________________________________   PROCEDURES  Procedure(s) performed: None  Critical Care performed: No  ____________________________________________   INITIAL IMPRESSION / ASSESSMENT AND PLAN / ED COURSE  Pertinent labs & imaging results that were available during my care of the patient were reviewed by me and considered in my medical decision making (see chart for details).  Acute  dental pain with dental caries. Rx given for amoxicillin 500 mg 3 times a day and oxycodone 5/325. List of local dental providers given to the patient upon discharge. Patient follow-up with PCP or return to ER with any worsening symptomology.  Clinical Course    ____________________________________________   FINAL CLINICAL IMPRESSION(S) / ED DIAGNOSES  Final  diagnoses:  Pain, dental  Dental caries     This chart was dictated using voice recognition software/Dragon. Despite best efforts to proofread, errors can occur which can change the meaning. Any change was purely unintentional.    Evangeline Dakin, PA-C 10/04/15 1646    Sharyn Creamer, MD 10/08/15 [phone]

## 2015-09-11 NOTE — ED Triage Notes (Signed)
Toothache x 1 month.  NAD.  (Pt is blind.)

## 2015-09-11 NOTE — ED Provider Notes (Signed)
[facility] Emergency Department Provider Note  ____________________________________________  Time seen: Approximately 3:10 PM  I have reviewed the triage vital signs and the nursing notes.   HISTORY  Chief Complaint Dental Pain    HPI Evan Reed is a 35 y.o. male presents for evaluation of dental pain. Patient states that he's got broken lower left tooth.   History reviewed. No pertinent past medical history.  There are no active problems to display for this patient.   History reviewed. No pertinent surgical history.  Prior to Admission medications   Medication Sig Start Date End Date Taking? Authorizing Provider  amoxicillin (AMOXIL) 500 MG tablet Take 1 tablet (500 mg total) by mouth 2 (two) times daily. 09/11/15   Charmayne Sheer Aldora Perman, PA-C  meloxicam (MOBIC) 15 MG tablet Take 1 tablet (15 mg total) by mouth daily. 09/11/15   Evangeline Dakin, PA-C  oxyCODONE-acetaminophen (ROXICET) 5-325 MG tablet Take 1-2 tablets by mouth every 4 (four) hours as needed for severe pain. 09/11/15   Evangeline Dakin, PA-C    Allergies Review of patient's allergies indicates no known allergies.  No family history on file.  Social History Social History  Substance Use Topics  . Smoking status: Never Smoker  . Smokeless tobacco: Never Used  . Alcohol use Not on file    Review of Systems Constitutional: No fever/chills Eyes: No visual changes. ENT: No sore throat.Positive for dental pain. Cardiovascular: Denies chest pain. Respiratory: Denies shortness of breath. Musculoskeletal: Negative for back pain. Skin: Negative for rash. Neurological: Negative for headaches, focal weakness or numbness.  10-point ROS otherwise negative.  ____________________________________________   PHYSICAL EXAM:  VITAL SIGNS: ED Triage Vitals  Enc Vitals Group     BP 09/11/15 1429 (!) 144/90     Pulse Rate 09/11/15 1429 74     Resp 09/11/15 1429 18     Temp 09/11/15  1429 98.1 F (36.7 C)     Temp Source 09/11/15 1429 Oral     SpO2 09/11/15 1429 97 %     Weight 09/11/15 1429 200 lb (90.7 kg)     Height --      Head Circumference --      Peak Flow --      Pain Score 09/11/15 1433 6     Pain Loc --      Pain Edu? --      Excl. in GC? --     Constitutional: Alert and oriented. Well appearing and in no acute distress. Mouth/Throat: Mucous membranes are moist.  Oropharynx non-erythematous. Obvious dental caries with broken fractured left lower molar. Neck: No stridor.   Cardiovascular: Normal rate, regular rhythm. Grossly normal heart sounds.  Good peripheral circulation. Respiratory: Normal respiratory effort.  No retractions. Lungs CTAB. Musculoskeletal: No lower extremity tenderness nor edema.  No joint effusions. Neurologic:  Normal speech and language. No gross focal neurologic deficits are appreciated. No gait instability. Skin:  Skin is warm, dry and intact. No rash noted. Psychiatric: Mood and affect are normal. Speech and behavior are normal.  ____________________________________________   LABS (all labs ordered are listed, but only abnormal results are displayed)  Labs Reviewed - No data to display ____________________________________________  EKG   ____________________________________________  RADIOLOGY   ____________________________________________   PROCEDURES  Procedure(s) performed: None  Critical Care performed: No  ____________________________________________   INITIAL IMPRESSION / ASSESSMENT AND PLAN / ED COURSE  Pertinent labs & imaging results that were available during my care of the patient  were reviewed by me and considered in my medical decision making (see chart for details).  Acute dental pain. Rx given for amoxicillin/meloxicam/Percocet. Patient follow-up PCP or return to ER with any worsening symptomology. Patient voices no other emergency medical complaints at this time was Given a List of Local  Dental Providers to See.  Clinical Course    ____________________________________________   FINAL CLINICAL IMPRESSION(S) / ED DIAGNOSES  Final diagnoses:  Pain, dental  Dental caries     This chart was dictated using voice recognition software/Dragon. Despite best efforts to proofread, errors can occur which can change the meaning. Any change was purely unintentional.    Evangeline Dakin, PA-C 09/11/15 1546    Sharyn Creamer, MD 09/11/15 [phone]

## 2016-01-10 DIAGNOSIS — B070 Plantar wart: Secondary | ICD-10-CM | POA: Diagnosis not present

## 2016-01-10 DIAGNOSIS — L84 Corns and callosities: Secondary | ICD-10-CM | POA: Diagnosis not present

## 2016-02-07 DIAGNOSIS — S0181XA Laceration without foreign body of other part of head, initial encounter: Secondary | ICD-10-CM | POA: Diagnosis not present

## 2016-03-14 ENCOUNTER — Ambulatory Visit: Payer: Self-pay | Admitting: Podiatry

## 2016-03-18 ENCOUNTER — Other Ambulatory Visit: Payer: Self-pay | Admitting: *Deleted

## 2016-03-18 ENCOUNTER — Encounter: Payer: Self-pay | Admitting: Podiatry

## 2016-03-18 ENCOUNTER — Ambulatory Visit (INDEPENDENT_AMBULATORY_CARE_PROVIDER_SITE_OTHER): Payer: Medicare Other | Admitting: Podiatry

## 2016-03-18 VITALS — BP 126/82 | HR 77 | Resp 16

## 2016-03-18 DIAGNOSIS — L989 Disorder of the skin and subcutaneous tissue, unspecified: Secondary | ICD-10-CM | POA: Diagnosis not present

## 2016-03-18 DIAGNOSIS — B070 Plantar wart: Secondary | ICD-10-CM | POA: Diagnosis not present

## 2016-03-18 NOTE — Progress Notes (Signed)
   Subjective:    Patient ID: Evan Reed, male    DOB: 22-Mar-1980, 36 y.o.   MRN: [id number]  HPI: He presents today with a chief complaint of painful lesions to the forefoot bilateral and to the lateral plantar lateral aspect of the left foot. States that he has had warts in the past but his family states that they may be pre-ulcerative diabetic lesions. 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    Review of Systems  HENT: Positive for hearing loss.   Eyes: Positive for visual disturbance.  Cardiovascular: Positive for leg swelling.  Musculoskeletal: Positive for arthralgias, back pain and gait problem.  All other systems reviewed and are negative.      Objective:   Physical Exam: Vital signs are stable alert and oriented 3. Pulses are palpable. Neurologic sensorium is intact. Deep tendon reflexes are intact. Muscle strength +5 over 5 dorsiflexion plantar flexors and inverters everters all options musculature is intact. Cutaneous evaluation demonstrates solitary porokeratosis lesions that do demonstrate verrucoid type signs such as circumferential skin lines no thrombosed capillaries are visible to any of these lesions all these lesions measure less than a centimeter in diameter. No signs of infection.        Assessment & Plan:  Assessment: Soft tissue lesions. The verrucoid in nature forefoot bilateral and plantar lateral left foot.  Plan: After local anesthesia was administered sublesionally each lesion was then surgically excised and sent for pathologic evaluation in separate containers. He was given both oral and written home instructions for care and soaking of his foot. Follow up with him in one week or so.

## 2016-03-18 NOTE — Patient Instructions (Signed)

## 2016-03-19 ENCOUNTER — Telehealth: Payer: Self-pay | Admitting: Podiatry

## 2016-03-19 MED ORDER — MELOXICAM 15 MG PO TABS: 15.0000 mg | ORAL_TABLET | Freq: Every day | ORAL | 0 refills | Status: AC

## 2016-03-19 NOTE — Telephone Encounter (Addendum)
Pt states he has used tylenol and advil without relief. I informed pt of Dr.Hyatt's orders and pt states he doesn't want tramadol he has been reading up on it an it causes seizures, doesn't have to have a narcotic just something for the pain. Dr. Al CorpusHyatt ordered Meloxicam 15mg  #30 one tablet every day. Informed pt of Dr. Geryl RankinsHyatt's orders and to use regular strength tylenol for additional pain coverage.

## 2016-03-19 NOTE — Telephone Encounter (Signed)
Pt is in extreme pt after visit yesterday. He said Tylenol is not helping.

## 2016-03-19 NOTE — Telephone Encounter (Signed)
Five days worth of tramadol.

## 2016-03-19 NOTE — Addendum Note (Signed)
Addended by: Alphia Kava'CONNELL, VALERY D on: 03/19/2016 01:31 PM   Modules accepted: Orders

## 2016-04-15 ENCOUNTER — Encounter: Payer: Self-pay | Admitting: Podiatry

## 2016-04-15 ENCOUNTER — Ambulatory Visit (INDEPENDENT_AMBULATORY_CARE_PROVIDER_SITE_OTHER): Payer: Self-pay | Admitting: Podiatry

## 2016-04-15 DIAGNOSIS — H9190 Unspecified hearing loss, unspecified ear: Secondary | ICD-10-CM | POA: Diagnosis not present

## 2016-04-15 DIAGNOSIS — M545 Low back pain: Secondary | ICD-10-CM | POA: Diagnosis not present

## 2016-04-15 DIAGNOSIS — B070 Plantar wart: Secondary | ICD-10-CM

## 2016-04-15 NOTE — Progress Notes (Signed)
He presents today for follow-up of surgical curettage. He states that they're still quite tender but they seem to be getting better. I've petrified a file and down since procedure was done.  Objective: Vital signs are stable alert and oriented 3 pathology report does demonstrate warts bilaterally. Reactive hyperkeratosis. To be healing over these warts. They do not appear to be infected but I think that he is healing with hypertrophic tissue. He has debrided these and they looked nice and clean and healthy and appear to be growing in normally.  Assessment: Well-healing surgical foot bilateral.  Plan: Follow up with me in the next few weeks if not improved.

## 2016-05-01 DIAGNOSIS — R509 Fever, unspecified: Secondary | ICD-10-CM | POA: Diagnosis not present

## 2016-05-01 DIAGNOSIS — R52 Pain, unspecified: Secondary | ICD-10-CM | POA: Diagnosis not present

## 2016-05-21 DIAGNOSIS — H903 Sensorineural hearing loss, bilateral: Secondary | ICD-10-CM | POA: Diagnosis not present

## 2016-06-05 ENCOUNTER — Ambulatory Visit
Admission: EM | Admit: 2016-06-05 | Discharge: 2016-06-05 | Disposition: A | Discharge status: Home / Self Care | Payer: Medicare Other | Attending: Family Medicine | Admitting: Family Medicine

## 2016-06-05 ENCOUNTER — Encounter: Payer: Self-pay | Admitting: *Deleted

## 2016-06-05 DIAGNOSIS — R05 Cough: Secondary | ICD-10-CM | POA: Diagnosis not present

## 2016-06-05 DIAGNOSIS — J029 Acute pharyngitis, unspecified: Secondary | ICD-10-CM | POA: Diagnosis not present

## 2016-06-05 DIAGNOSIS — R0981 Nasal congestion: Secondary | ICD-10-CM

## 2016-06-05 DIAGNOSIS — J069 Acute upper respiratory infection, unspecified: Secondary | ICD-10-CM

## 2016-06-05 DIAGNOSIS — J0100 Acute maxillary sinusitis, unspecified: Secondary | ICD-10-CM

## 2016-06-05 DIAGNOSIS — R509 Fever, unspecified: Secondary | ICD-10-CM | POA: Diagnosis not present

## 2016-06-05 HISTORY — DX: Congenital malformation of eye, unspecified: Q15.9

## 2016-06-05 HISTORY — DX: Gastro-esophageal reflux disease without esophagitis: K21.9

## 2016-06-05 LAB — RAPID STREP SCREEN (MED CTR MEBANE ONLY): STREPTOCOCCUS, GROUP A SCREEN (DIRECT): NEGATIVE

## 2016-06-05 MED ORDER — AMOXICILLIN-POT CLAVULANATE 875-125 MG PO TABS: 1.0000 | ORAL_TABLET | Freq: Two times a day (BID) | ORAL | 0 refills | Status: AC

## 2016-06-05 MED ORDER — LORATADINE-PSEUDOEPHEDRINE ER 10-240 MG PO TB24: 1.0000 | ORAL_TABLET | Freq: Every day | ORAL | 0 refills | Status: AC

## 2016-06-05 NOTE — ED Provider Notes (Signed)
[facility]    CSN: [id number] Arrival date & time: 06/05/16  1219     History   Chief Complaint Chief Complaint  Patient presents with  . Nasal Congestion  . Cough  . Fever    HPI Evan Reed is a 36 y.o. male.   Patient is a 36 year old white male with a history of days congestion headaches sore throat and sinus pressure. Everything started about 5 days ago that has progressively gotten worse according to his S/O he is also started having fever yesterday today sling came in to be seen and evaluated. Past medical history history of cataract surgery is also has hearing impairment. He's also had a sore throat with sinus drainage and sinus congestion and fever. He's had cataract surgery he has a history of GERD. No pertinent family medical history relevant to today's visit and he does smoke no known drug allergies.   The history is provided by the patient and the spouse.  Cough  Cough characteristics:  Non-productive Sputum characteristics:  Yellow and green Severity:  Moderate Duration:  5 days Timing:  Constant Progression:  Worsening Chronicity:  New Smoker: yes   Context: upper respiratory infection   Relieved by:  None tried Ineffective treatments:  None tried Associated symptoms: fever, rhinorrhea, sinus congestion and sore throat   Fever  Associated symptoms: cough, rhinorrhea and sore throat     Past Medical History:  Diagnosis Date  . Eye abnormality   . GERD (gastroesophageal reflux disease)     There are no active problems to display for this patient.   Past Surgical History:  Procedure Laterality Date  . CATARACT EXTRACTION, BILATERAL         Home Medications    Prior to Admission medications   Medication Sig Start Date End Date Taking? Authorizing Provider  pantoprazole (PROTONIX) 40 MG tablet Take 40 mg by mouth.   Yes Historical Provider, MD  amoxicillin-clavulanate (AUGMENTIN) 875-125 MG tablet Take 1 tablet by mouth  2 (two) times daily. 06/05/16   Hassan Rowan, MD  loratadine-pseudoephedrine (CLARITIN-D 24 HOUR) 10-240 MG 24 hr tablet Take 1 tablet by mouth daily. 06/05/16   Hassan Rowan, MD  meloxicam (MOBIC) 15 MG tablet Take 1 tablet (15 mg total) by mouth daily. 03/19/16   Max Maud Deed, DPM    Family History Family History  Problem Relation Age of Onset  . Liver cancer Father     Social History Social History  Substance Use Topics  . Smoking status: Current Every Day Smoker    Packs/day: 1.00    Types: Cigarettes  . Smokeless tobacco: Never Used  . Alcohol use No     Allergies   Patient has no known allergies.   Review of Systems Review of Systems  Unable to perform ROS: Other  Constitutional: Positive for fever.  HENT: Positive for rhinorrhea and sore throat.   Respiratory: Positive for cough.      Physical Exam Triage Vital Signs ED Triage Vitals  Enc Vitals Group     BP 06/05/16 1240 131/73     Pulse Rate 06/05/16 1240 79     Resp 06/05/16 1240 18     Temp 06/05/16 1240 99 F (37.2 C)     Temp Source 06/05/16 1240 Oral     SpO2 06/05/16 1240 98 %     Weight 06/05/16 1241 250 lb (113.4 kg)     Height 06/05/16 1241  (1.803 m)     Head Circumference --  Peak Flow --      Pain Score 06/05/16 1243 0     Pain Loc --      Pain Edu? --      Excl. in GC? --    No data found.   Updated Vital Signs BP 131/73 (BP Location: Left Arm)   Pulse 79   Temp 99 F (37.2 C) (Oral)   Resp 18   Ht  (1.803 m)   Wt 250 lb (113.4 kg)   SpO2 98%   BMI 34.87 kg/m   Visual Acuity Right Eye Distance:   Left Eye Distance:   Bilateral Distance:    Right Eye Near:   Left Eye Near:    Bilateral Near:     Physical Exam  Constitutional: He is oriented to person, place, and time. He appears well-developed and well-nourished.  HENT:  Head: Normocephalic and atraumatic.  Right Ear: External ear normal.  Left Ear: External ear normal.  Nose: Nose normal.    Mouth/Throat: Oropharynx is clear and moist.  Eyes: Pupils are equal, round, and reactive to light.  Neck: Normal range of motion. Neck supple.  Cardiovascular: Normal rate and regular rhythm.   Pulmonary/Chest: Effort normal.  Musculoskeletal: Normal range of motion.  Lymphadenopathy:    He has cervical adenopathy.  Neurological: He is alert and oriented to person, place, and time.  Skin: Skin is warm.  Psychiatric: He has a normal mood and affect.  Vitals reviewed.    UC Treatments / Results  Labs (all labs ordered are listed, but only abnormal results are displayed) Labs Reviewed  RAPID STREP SCREEN (NOT AT [facility])  CULTURE, GROUP A STREP [facility])    EKG  EKG Interpretation None       Radiology No results found.  Procedures Procedures (including critical care time)  Medications Ordered in UC Medications - No data to display  Results for orders placed or performed during the hospital encounter of 06/05/16  Rapid strep screen  Result Value Ref Range   Streptococcus, Group A Screen (Direct) NEGATIVE NEGATIVE   Initial Impression / Assessment and Plan / UC Course  I have reviewed the triage vital signs and the nursing notes.  Pertinent labs & imaging results that were available during my care of the patient were reviewed by me and considered in my medical decision making (see chart for details).   strep test is negative question whether this individual 5 days history of symptoms needs an antibiotic because of the redness of the throat mild nasal congestion fact he does smoke we'll place Augmentin 875 one tablet twice a day for 10 days Claritin-D 1 tablet daily work note for today and tomorrow follow-up PCP as needed.    Final Clinical Impressions(s) / UC Diagnoses   Final diagnoses:  Upper respiratory tract infection, unspecified type  Acute maxillary sinusitis, recurrence not specified  Pharyngitis, unspecified etiology    New Prescriptions New  Prescriptions   AMOXICILLIN-CLAVULANATE (AUGMENTIN) 875-125 MG TABLET    Take 1 tablet by mouth 2 (two) times daily.   LORATADINE-PSEUDOEPHEDRINE (CLARITIN-D 24 HOUR) 10-240 MG 24 HR TABLET    Take 1 tablet by mouth daily.      Note: This dictation was prepared with Dragon dictation along with smaller phrase technology. Any transcriptional errors that result from this process are unintentional.   Hassan Rowan, MD 06/05/16 [phone]

## 2016-06-05 NOTE — ED Triage Notes (Signed)
Patient started having symptoms of nasal congestion / drainage, cough, and fever yesterday. Additional symptom of sore throat started late in the pm and this am.

## 2016-06-06 ENCOUNTER — Telehealth: Payer: Self-pay

## 2016-06-06 LAB — CULTURE, GROUP A STREP (THRC)

## 2016-06-06 NOTE — Telephone Encounter (Signed)
Patient was informed of lab results from recent visit. Patient was informed that if symptoms persist or worsen to see their PCP or follow up with [facility].  

## 2016-06-06 NOTE — Telephone Encounter (Signed)
-----   Message from Hassan Rowan, MD sent at 06/06/2016 12:32 PM EDT ----- Result 5 patient that he did grow a atypical Streptococcus on his throat swab. He was placed on Augmentin at that visit that should cover him but please finished the antibiotics and follow-up PCP if there's any problems or questions

## 2017-03-06 ENCOUNTER — Telehealth: Payer: Self-pay | Admitting: General Practice

## 2017-03-24 NOTE — Telephone Encounter (Signed)
He was unavailable when I called to see if he could be scheduled for his MWV and Physical f/u.

## 2017-04-21 ENCOUNTER — Telehealth: Payer: Self-pay | Admitting: General Practice

## 2017-04-28 NOTE — Telephone Encounter (Signed)
Closed.  No [facility] appts

## 2021-01-09 ENCOUNTER — Emergency Department (HOSPITAL_COMMUNITY): Payer: Self-pay | Admitting: Emergency Medicine

## 2021-07-04 ENCOUNTER — Ambulatory Visit (INDEPENDENT_AMBULATORY_CARE_PROVIDER_SITE_OTHER): Payer: Self-pay | Admitting: Cornea and External Diseases Specialist

## 2021-07-11 ENCOUNTER — Encounter (INDEPENDENT_AMBULATORY_CARE_PROVIDER_SITE_OTHER): Payer: Self-pay | Admitting: Optometrist

## 2021-07-11 ENCOUNTER — Ambulatory Visit: Payer: 59 | Attending: Optometrist | Admitting: Optometrist

## 2021-07-11 ENCOUNTER — Other Ambulatory Visit: Payer: Self-pay

## 2021-07-11 DIAGNOSIS — H3552 Pigmentary retinal dystrophy: Secondary | ICD-10-CM

## 2021-07-11 DIAGNOSIS — H16223 Keratoconjunctivitis sicca, not specified as Sjogren's, bilateral: Secondary | ICD-10-CM | POA: Insufficient documentation

## 2021-07-11 DIAGNOSIS — H0102A Squamous blepharitis right eye, upper and lower eyelids: Secondary | ICD-10-CM

## 2021-07-11 DIAGNOSIS — H0102B Squamous blepharitis left eye, upper and lower eyelids: Secondary | ICD-10-CM

## 2021-07-11 NOTE — Procedures (Signed)
[facility]  [address]  [place] [address]  Operated by [facility]  Procedure Note    Name: Steve Cohen MRN:  [id number]   Date: 07/11/2021 Age: 41 y.o.       [phone] - CLOSURE OF THE LACRIMAL PUNCTUM BY PLUG, EACH (AMB ONLY)  Performed by: Rachana Malesky, [place], OD  Authorized by: Whittney Steenson, [place], OD     Documentation:      A drop of proparacaine to right eye  Oasis0.5 mm plug placed inRLLpunctum. No excessive tightness. Plug sitting well.    Eye Supplies: Punctal plug  Eyelid Site #1: RLL  Eyelid Site #2: LLL      A drop of proparacaine toleft eye.   Oasis0.5 mm plug placed inLLLpunctum. No excessive tightness. Plug sitting well.    The patient tolerated the procedure well and was discharged home.          [place] Maday Guarino, OD

## 2021-07-11 NOTE — Progress Notes (Addendum)
[facility]  [address]  [place] [address]  Operated by [facility]         Patient Name: Steve Cohen  MRN#: [id number]  Birthdate: Jun 16, 1980    Date of Service: 07/11/2021    Chief Complaint    New Patient         Steve Cohen is a 41 y.o. male who presents today for evaluation/consultation of:  HPI     New Patient     Additional comments: Pt referred here from Dr. Laqueta Due for dry eye and eye pain OU. Pseudophakic OU           Comments    Pt states that within the last year, both eyes have been really dry and irritated   Pt reports that along with the stabbing pain that he gets (OD > OS), he experiences light sensitivity   He states that it is not unusual for him to just sit and keep his eyes closed for most of the day   Pt is currently using Refresh Celluvisc QID and Systane PF tears q1h for the dryness   On restasis twice daily   Pt was on oral doxycyline 50 mg PO daily for several months and stopped ~ 2 weeks ago   Currently using warm compressed and lid scrubs 2x/day   Had temporary punctal plug in the lower right eye 2 months ago                  Last edited by Tuleen Mandelbaum, [place], OD on 07/11/2021  4:13 PM.        ROS    Positive for: HENT (usher syndrome), Eyes (usher's syndrome)  Last edited by Mayrani Khamis, [place], OD on 07/11/2021  4:13 PM.         All other systems Negative    Adair Patter, COA  07/11/2021, 14:04        Base Eye Exam     Visual Acuity (Snellen - Linear)       Right Left    Dist sc HM HM    Dist ph sc NI NI          Tonometry (icare, 2:13 PM)       Right Left    Pressure 23 14          Pupils       Shape React APD    Right Round Minimal None    Left Round Minimal None          Visual Fields       Right Left    Restrictions Partial outer superior temporal, inferior temporal, superior nasal, inferior nasal deficiencies Partial outer superior temporal, inferior temporal, superior nasal, inferior nasal deficiencies   HM for OU            Extraocular Movement       Right Left     Full, Ortho Full, Ortho          Neuro/Psych     Oriented x3: Yes    Mood/Affect: Normal            Slit Lamp and Fundus Exam     External Exam       Right Left    External Normal Normal          Slit Lamp Exam       Right Left    Lids/Lashes Normal Normal    Conjunctiva/Sclera White and quiet White and quiet  Cornea small supr linear abrasion, 3 sec tbut 3 tbut    Anterior Chamber Deep and quiet Deep and quiet    Iris Round and reactive Round and reactive    Lens Posterior chamber intraocular lens Posterior chamber intraocular lens            Refraction     Manifest Refraction (Auto)       Sphere Cylinder Axis    Right -0.50 +0.25 070    Left -1.25 +0.50 040                MD Addition to HPI: pt referred by Dr. Laqueta Due for dry eye evaluation. Pt is currently taking Refresh celluvisc drops TID OU, systane PF tears q1h, was taking oral doxycycline 50 mg PO daily but d/c 2 weeks ago, restasis BID OU, warm compresses twice daily OU. He was given temporary punctal plug OD last visit 2 months ago that helped initially then worsened. He has been taking e-mycin oinment prn for irritation.          ENCOUNTER DIAGNOSES     ICD-10-CM   1. Keratoconjunctivitis sicca of both eyes not specified as Sjogren's  H16.223   2. Squamous blepharitis of upper and lower eyelids of both eyes  H01.02A    H01.02B     Orders Placed This Encounter   Procedures   . 49675 - CLOSURE OF THE LACRIMAL PUNCTUM BY PLUG, EACH (AMB ONLY)       Ophthalmic Plan of Care:  1. Dry eyes OU -   -referred by Dr. Laqueta Due for dry eye evaluation   -switch from Refresh celluvisc to Refresh PM tears QID OU as needed - pt preferred thickness of e-mycin ointment - discussed similarities.   -cont systane PF ATs prn   -cont Restasis BID OU   -lower punctal plugs 0.5 mm instilled today  -recommended referral to Dr. Fran Lowes for further management    2. Blepharitis OU  -hold of oral doxycyline tabs for now  -continue warm compresses  with lid massage twice daily   -add washing of eyelids twice daily with baby shampoo    3. Usher's syndrome OU -   -pt follows with Dr. Laqueta Due  -HM OD/OS    Follow up:    I have asked Steve Cohen to follow up 1 month dry eye follow up with me if needed/next available Dr. Fran Lowes           I have seen and examined the above patient. I discussed the above diagnoses listed in the assessment and the above ophthalmic plan of care with the patient and patient's family. All questions were answered. I reviewed and, when necessary, made changes to the technician/resident note, documented ophthalmology exam, chief complaint, history of present illness, allergies, review of systems, past medical, past surgical, family and social history. I personally reviewed and interpreted all testing and/or imaging performed at this visit and agree with the resident's or fellow's interpretation. Any exceptions/additions are edited/noted in the relevant encounter fields.      [place] Kayela Humphres, OD  07/11/2021, 16:14

## 2021-07-11 NOTE — Patient Instructions (Signed)
Refresh PM 3-4x/day   Baby shampoo and warm water twice daily

## 2021-09-19 ENCOUNTER — Ambulatory Visit (INDEPENDENT_AMBULATORY_CARE_PROVIDER_SITE_OTHER): Payer: Self-pay | Admitting: Ophthalmology

## 2021-11-21 ENCOUNTER — Other Ambulatory Visit: Payer: Self-pay

## 2021-11-21 ENCOUNTER — Encounter (INDEPENDENT_AMBULATORY_CARE_PROVIDER_SITE_OTHER): Payer: Self-pay | Admitting: Ophthalmology

## 2021-11-21 ENCOUNTER — Ambulatory Visit: Payer: 59 | Attending: Ophthalmology | Admitting: Ophthalmology

## 2021-11-21 DIAGNOSIS — Z961 Presence of intraocular lens: Secondary | ICD-10-CM | POA: Insufficient documentation

## 2021-11-21 DIAGNOSIS — H903 Sensorineural hearing loss, bilateral: Secondary | ICD-10-CM | POA: Insufficient documentation

## 2021-11-21 DIAGNOSIS — H3552 Pigmentary retinal dystrophy: Secondary | ICD-10-CM | POA: Insufficient documentation

## 2021-11-21 DIAGNOSIS — H0288A Meibomian gland dysfunction right eye, upper and lower eyelids: Secondary | ICD-10-CM

## 2021-11-21 DIAGNOSIS — H0288B Meibomian gland dysfunction left eye, upper and lower eyelids: Secondary | ICD-10-CM

## 2021-11-21 DIAGNOSIS — H01003 Unspecified blepharitis right eye, unspecified eyelid: Secondary | ICD-10-CM

## 2021-11-21 DIAGNOSIS — H01006 Unspecified blepharitis left eye, unspecified eyelid: Secondary | ICD-10-CM

## 2021-11-21 DIAGNOSIS — H04123 Dry eye syndrome of bilateral lacrimal glands: Secondary | ICD-10-CM | POA: Insufficient documentation

## 2021-11-21 DIAGNOSIS — H02889 Meibomian gland dysfunction of unspecified eye, unspecified eyelid: Secondary | ICD-10-CM | POA: Insufficient documentation

## 2021-11-21 MED ORDER — FLUOROMETHOLONE 0.1 % EYE DROPS,SUSPENSION
1.0000 [drp] | Freq: Two times a day (BID) | OPHTHALMIC | 1 refills | Status: AC
Start: 2021-11-21 — End: 2021-12-05

## 2021-11-21 MED ORDER — DOXYCYCLINE MONOHYDRATE 50 MG CAPSULE
50.0000 mg | ORAL_CAPSULE | Freq: Two times a day (BID) | ORAL | 5 refills | Status: DC
Start: 2021-11-21 — End: 2022-07-01

## 2021-11-21 MED ORDER — CYCLOSPORINE 0.05 % EYE DROPS IN A DROPPERETTE
1.0000 [drp] | Freq: Two times a day (BID) | OPHTHALMIC | 12 refills | Status: AC
Start: 2021-11-21 — End: ?

## 2021-11-21 NOTE — Progress Notes (Addendum)
[facility]  [address]  [place] [address]  Operated by [facility]         Patient Name: Steve Cohen  MRN#: [id number]  Birthdate: 05/29/1980    Date of Service: 11/21/2021    Chief Complaint    Follow Up Cornea Problem         Christpher Stogsdill is a 41 y.o. male who presents today for evaluation/consultation of:  HPI       Follow Up Cornea Problem     Additional comments: Pt here for f/u Dry eyes OU and blepharitis  Pt states vision seems worse  Pt c/o new flashes  Pt denies any floaters   Pt uses Celluvisc over 4 + day ou  Pt uses w/c and lid massages  Pt states eyes burn           Last edited by Louanne Belton, COA on 11/21/2021  2:57 PM.        ROS    Positive for: Cardiovascular, Eyes  Negative for: Constitutional, Gastrointestinal, Neurological, Skin, Genitourinary, Musculoskeletal, HENT, Endocrine, Respiratory, Psychiatric, Allergic/Imm, Heme/Lymph  Last edited by Louanne Belton, COA on 11/21/2021  2:57 PM.         All other systems Negative    Louanne Belton, COA  11/21/2021, 15:04        Base Eye Exam       Visual Acuity (Snellen - Linear)         Right Left    Dist sc CF at 2' HM    Dist ph sc NI               Tonometry (Tonopen, 3:14 PM)         Right Left    Pressure 17 19              Pupils         APD    Right None    Left None*-*-*-*-*-*-*-*-*-*-*-*-*-*-*-*-*-*-*-*-*-*-*-*-*-*-*-*-*-*-*-*-*-*-*-*-*-*-*-*-*-*-*-*-*-*-*-*-*-*-*-*-*-*-*-*-*-*-*-*-*-*-*-*-*-*-*-*-*-*-*-*-*-*-*-*-*-*-*-*-              Visual Fields         Right Left    Restrictions Partial outer superior temporal, inferior temporal, superior nasal, inferior nasal deficiencies Partial outer superior temporal, inferior temporal, superior nasal, inferior nasal deficiencies              Extraocular Movement         Right Left     Full Full              Neuro/Psych       Oriented x3: Yes    Mood/Affect: Normal                  Slit Lamp and Fundus Exam       External Exam         Right  Left    External Normal               Slit Lamp Exam         Right Left    Lids/Lashes 3+Telangiectasia, 1+ MG plugging, 2+ foamy tear film, RLL plug in place 3+ foamy tear film, 1+ MG plugging, 3+ telangiectasias, LLL plug in place    Conjunctiva/Sclera White and quiet White and quiet    Cornea good tear film height, mildly decreased TBUT good tear film height, midly decreased TBUT    Anterior Chamber Deep and quiet Deep and quiet    Iris Round and reactive Round and  reactive    Lens PCIOL, no PCO PCIOL, no PCO                  Lacrimal Exam       Schirmers         Right Left     17 mm 14 mm      Anesthesia: Yes                    MD Addition to HPI: Pt here for f/u dry eyes OU and blepharitis. Pt states vision seems worse. Pt c/o new flashes. Pt denies any floaters. Pt uses warm compress and lid massages. Pt states that eyes burn. Pt uses Systane and Celluvisc           ENCOUNTER DIAGNOSES     ICD-10-CM   1. Meibomian gland disease, unspecified laterality  H02.889   2. Dry eyes, bilateral  H04.123   3. Pseudophakia of both eyes  Z96.1   4. Retinitis pigmentosa of both eyes  H35.52   5. Usher syndrome  H35.52    H90.3     No orders of the defined types were placed in this encounter.      Ophthalmic Plan of Care:    1. Dry eyes and Blepharitis OU   - Referred by Dr. Landis Gandy to Dr. Pervis Hocking, who referred pt here  - Schirmer's today (11/21/2021): 17 OD, 14 OS (normal OU)  - BLL plugs in place on exam today  - Manual expression of MG today reveals upper MG with moderately decreased meibum quantity and mildly decreased meibum quality; lower MG with moderately decreased meibum quantity and mildly decreased meibum quality OD.  - Manual expression of MG today reveals upper MG with moderately decreased meibum quantity and moderately decreased meibum quality; lower MG with moderately decreased meibum quantity and moderately decreased meibum quality OS.  - Pt reports previously using doxycycline.  Denies GI upset, but pt feels  it failed to improve symptoms after using for about a month.  - Start doxycycline 50 mg po BID.  Discussed with patient to take with food to avoid GI upset, drink plenty of water to avoid dehydration, avoid taking at the same time as multivitamins and calcium pills, and wear sunscreen or sun protection to decrease risk of sunburn while taking this medication.   - Discussed VISIONS program for assistance for low vision status (contact information and referral provided)  - Start Restasis BID OU  - Start FML BID OU for 2 weeks, then stop  - Continue Systane PRN OU and Refresh Celluvisc BID OU  - Continue WCs and eyelid cleaning w/ baby shampoo  - Pt interested in serum tears; Will consider if symptoms fail to improve     2. Usher's syndrome OU -   - pt follows with Dr. Landis Gandy  - HM OD/OS    Follow up:    I have asked Nicodemus Denk to follow up in 6 weeks Billie Ruddy)          I have seen and examined the above patient. I discussed the above diagnoses listed in the assessment and the above ophthalmic plan of care with the patient and patient's family. All questions were answered. I reviewed and, when necessary, made changes to the technician/resident note, documented ophthalmology exam, chief complaint, history of present illness, allergies, review of systems, past medical, past surgical, family and social history. I personally reviewed and interpreted all testing and/or imaging performed at this visit and  agree with the resident's or fellow's interpretation. Any exceptions/additions are edited/noted in the relevant encounter fields.      I am scribing for, and in the presence of Dr. Fran Lowes for services provided on 11/21/2021.  Enid Baas, SCRIBE   [place], [place]  11/21/2021, 15:35    I am supervising Octaviano Batty as she scribes for and in the presence of Dr. Fran Lowes for services provided on 11/21/2021.  Calla Kicks, SCRIBE 11/21/2021, 15:35

## 2022-01-09 ENCOUNTER — Ambulatory Visit (INDEPENDENT_AMBULATORY_CARE_PROVIDER_SITE_OTHER): Payer: Self-pay | Admitting: PHYSICIAN ASSISTANT

## 2022-03-20 ENCOUNTER — Ambulatory Visit (INDEPENDENT_AMBULATORY_CARE_PROVIDER_SITE_OTHER): Payer: Self-pay | Admitting: Ophthalmology

## 2022-03-20 ENCOUNTER — Encounter (INDEPENDENT_AMBULATORY_CARE_PROVIDER_SITE_OTHER): Payer: Self-pay

## 2022-06-28 ENCOUNTER — Encounter (INDEPENDENT_AMBULATORY_CARE_PROVIDER_SITE_OTHER): Payer: Self-pay | Admitting: Urology

## 2022-07-01 ENCOUNTER — Ambulatory Visit: Payer: 59 | Attending: Urology | Admitting: Urology

## 2022-07-01 ENCOUNTER — Encounter (INDEPENDENT_AMBULATORY_CARE_PROVIDER_SITE_OTHER): Payer: Self-pay | Admitting: Urology

## 2022-07-01 ENCOUNTER — Other Ambulatory Visit: Payer: Self-pay

## 2022-07-01 VITALS — BP 138/96 | HR 75 | Temp 98.4°F | Ht 71.0 in | Wt 265.2 lb

## 2022-07-01 DIAGNOSIS — N201 Calculus of ureter: Secondary | ICD-10-CM

## 2022-07-01 MED ORDER — HYDROCODONE 7.5 MG-ACETAMINOPHEN 325 MG TABLET
1.0000 | ORAL_TABLET | Freq: Three times a day (TID) | ORAL | 0 refills | Status: AC | PRN
Start: 2022-07-01 — End: ?

## 2022-07-01 NOTE — Progress Notes (Signed)
[facility]  Urologic Surgery Office New Patient Visit Note    Name: Steve Cohen MRN:  [id number]   Date of Birth: 1980/03/17 Age: 42 y.o.   Date: 07/01/2022         PCP: Karolee Stamps, MD    I am seeing Steve Cohen in consultation for:  Ureteral calculus    History of Present Illness:  Steve Cohen is a 42 y.o. male who presents to the urology office for evaluation of the above chief complaint.  The patient reports a history of severe left flank pain prompting him to be evaluated by the emergency department.  He was discharged to home only to return twice more times to the ER.  His pain has stable today and he reports no nausea and vomiting today although this has been an issue with his initial presentation an ER balance backs.  He reports no previous stone history but does endorse a family history of nephrolithiasis via his mother.  The patient denies any history of gross hematuria, recurrent urinary tract infection, STD, previous personal history of calculus disease, family history of prostate cancer and no history of urologic surgery.          Past Medical History:   Diagnosis Date    Usher syndrome       Past Surgical History:   Procedure Laterality Date    CATARACT EXTRACTION Bilateral     NC    SINUS SURGERY       No Known Allergies  Current Outpatient Medications   Medication Sig    atorvastatin (LIPITOR) 10 mg Oral Tablet Take 1 Tablet (10 mg total) by mouth Every evening (Patient not taking: Reported on 06/28/2022)    busPIRone (BUSPAR) 30 mg Oral Tablet Take 1 Tablet (30 mg total) by mouth Twice daily    cycloSPORINE (RESTASIS) 0.05 % Ophthalmic Dropperette Instill 1 Drop into both eyes Every 12 hours (Patient not taking: Reported on 06/28/2022)    doxycycline 50 mg Oral Capsule Take 1 Capsule (50 mg total) by mouth Twice daily (Patient not taking: Reported on 06/28/2022)    Ibuprofen (MOTRIN) 800 mg Oral Tablet Take 1 Tablet (800 mg total) by mouth Twice  daily    lisinopriL (PRINIVIL) 10 mg Oral Tablet Take 2 Tablets (20 mg total) by mouth Once a day    pantoprazole (PROTONIX) 40 mg Oral Tablet, Delayed Release (E.C.) Take 1 Tablet (40 mg total) by mouth    PARoxetine (PAXIL) 20 mg Oral Tablet Take 1 Tablet (20 mg total) by mouth Every morning    rosuvastatin (CRESTOR) 40 mg Oral Tablet Take 1 Tablet (40 mg total) by mouth Once a day    sucralfate (CARAFATE) 1 gram Oral Tablet Take 1 Tablet (1 g total) by mouth Three times daily before meals     Family History       Problem Relation Age of Onset Comments    Multiple Myeloma Sister      Stomach Cancer Other             Social History     Socioeconomic History    Marital status: Single   Tobacco Use    Smoking status: Every Day     Types: Cigarettes    Smokeless tobacco: Never   Substance and Sexual Activity    Alcohol use: Not Currently     Social Determinants of Health     Social Connections: Unknown (12/13/2019)    Received from [facility]  Social Connections     Frequency of Communication with Friends and Family: Not asked     Frequency of Social Gatherings with Friends and Family: Not asked   Intimate Partner Violence: Unknown (12/13/2019)    Received from [facility]    Intimate Partner Violence     Fear of Current or Ex-Partner: Not asked     Emotionally Abused: Not asked     Physically Abused: Not asked     Sexually Abused: Not asked   Housing Stability: Not At Risk (04/20/2020)    [facility] Stability     Was there a time when you did not have a steady place to sleep: Not asked     Worried that the place you are staying is making you sick: Not asked        Review of Systems:  A 12-point extended review of systems was performed.  All systems are negative, except for the new positives and/or changes that have been noted above in the history of present illness & interval history.    Afebrile, vital signs stable (please see rooming notes for details)    Physical Exam:       Constitutional: normal, alert, cooperative and no distress  Eyes: anicteric sclerae, extraocular eye movements intact and no ptosis  HENMT: normocephalic, atraumatic. nares are symmetric, no nasal flaring or respiratory distress. No obvious lesions or masses. No lip cyanosis.  Cardiovascular: no digital clubbing or cyanosis present, no palpable abdominal aortic aneurysm. No peripheral edema.  Pulmonary/Respiration: normal respiratory rate and non-labored, comfortable respiratory effort without recruitment of accessory respiratory muscles.   Abdominal: Soft, non-distended, non-tender. No flank pain. No masses or hepatosplenomegaly.  Musculoskeletal: normal station and posture. Moves all extremities.  Neurological: cranial nerves II through XII intact  Psychiatric: alert, oriented, normal speech, no focal findings or movement disorder noted.  Exhibits appropriate insight during discussion.  Skin: normal coloration and turgor, no rashes, no suspicious skin lesions noted.  Hematological/Immunological: no ecchymosis, no petechiae, no bleeding gums and no jaundice  Lymphatic: no femoral or inguinal palpable lymphadenopathy    I have counseled the patient regarding laser lithotripsy stone extraction and possible left ureteral stenting, an operation for left proximal ureteral.  The disease process was explained and illustrated; the procedure, alternatives, benefits, risks, complications, and post-operative recovery were all discussed in detail with the patient. The patient understands and agrees to proceed with the operation.    Diagnosis:  1. 4 mm left proximal ureteral calculus  -has been seen at the [facility] emergency department 3 times  2. Tobacco abuse  3. Blindness  4. First-degree family history of nephrolithiasis via his mother    Plan:  1. Urine culture  2. We discussed lithogenesis and stone prevention; stone prevention education handout given today.   3. I offered him left ureteroscopy  with basket extraction for this week but he declines; he would prefer 1 more additional week of medical expulsive therapy despite a total of 3 visits to the emergency department already.  4. E refill analgesia  5. Continue Flomax 0.4 mg q.day  6. Repeat CT scan on Monday May 27th   7. Schedule left ureteroscopy with laser lithotripsy stone extraction and possible left ureteral stenting for Tuesday May 28th if CT scan the day prior redemonstrates the left ureter stone.       Doralee Albino. Kaniel Kiang, MD   [facility]

## 2022-07-02 LAB — URINE CULTURE: URINE CULTURE: NO GROWTH

## 2022-07-03 ENCOUNTER — Encounter (HOSPITAL_COMMUNITY): Payer: Self-pay | Admitting: Urology

## 2022-07-03 ENCOUNTER — Other Ambulatory Visit (HOSPITAL_COMMUNITY): Payer: Self-pay | Admitting: Urology

## 2022-07-03 DIAGNOSIS — Z01818 Encounter for other preprocedural examination: Secondary | ICD-10-CM

## 2022-07-03 NOTE — OR PreOp (Signed)
Steve Cohen   Pre-Admission Testing Nurse: [phone]  Surgery Date: MAY 28-24                                                   PAT MAY 24-24 @10 :00  Arrive at: [facility]  Procedure: URETEROSCOPY w/ LASER LITHOTRIPSY STONE EXTRACTION W/ POSSIBLE STENT PLACEMENT: 52356 (CPT)    Bring a complete and accurate list of your medications (including dosages) AND past medical and surgical history. This helps to ensure you received the safest care possible.   If you are taking any over-the-counter substances, such as herbal supplements, diet pills, pain relievers or other non prescription medication, please notify the Pre-Admission Testing Nurse, your doctor and any other health team member interviewing you or performing any assessment. DO NOT take any over-the-counter medications for 8 days prior to your procedure, unless otherwise instructed by your physician. Failure to disclose this information could be life threatening.   DO NOT eat or drink anything after midnight prior to surgery including: NO GUM, NO MINTS, NO TOBACCO OR TOBACCO PRODUCTS OF ANY KIND - 24 HOURS PRIOR TO SURGERY. (including but not limited to: cigarettes, cigars, chewing tobacco, snuff, pipe, vaping etc.), Approved medications may be taken with a sip of water the morning of your surgery. You may brush your teeth the morning of your surgery.   Do not wear make-up, lotions, or colored fingernail polish the day of surgery.   Leave all jewelry (including piercing) at home due to the risk of injury. No jewelry will be permitted in the Operating Room. ALL piercings must be removed, metal, plastic, silicone, etc.   If you have any prosthetics, please inform your pre-admission testing nurse.   Wear loose-fitting, comfortable clothing.   If you require reading glasses or hearing aids, please bring them with you the day of surgery. Your dentures can remain in place until immediately before your procedure, but do not use any  denture adhesive. They will be returned once you are awake and alert in the Recovery Room.   If you require interpretive services, our facility will provide these for you. Please ask if you have any questions regarding this service.   If required for your procedure, you will be given a Hibiclens Soap Kit with instructions for use at home. The kit is to be used the night before AND the morning of surgery following the instructions given.   When you arrive at the area specified, the nurse will prepare you for your procedure and complete any additional blood work/paperwork. An IV will be started as needed.   One support person/family member will be able to join you in the pre-operative area once the per-op nurse has prepared you for your procedure. Children may have both parents with them.   When you are taken to the Operating Room, your family will be shown to the waiting room. Your family will be provided with a case number so they can track the progress of your surgical case while in the waiting area. When your procedure has been completed, your surgeon will speak to your family.   After your procedure, you will be taken to the Recovery Room until you are awake and alert (30 minutes - 2 hours). You will then be transported to the appropriate area for further care.   You can expect  to experience some discomfort and possible nausea. Discuss this with your nurse so that medications can be given, if appropriate.   Some procedures may require you to assume a special position in the recovery period. We will explain this if it applies to you.   It may also be necessary for you to have tubes after surgery (catheter, drains, from incision).   After surgery, you will need to breathe deeply and to cough (helping to prevent a form of pneumonia that can occur after having general anesthesia).   Medications to be taken the morning of surgery with a sip of water: PROTONIX   Last dose of Metformin 48 hours prior to  surgery: DENIES   Additional comments: NO TOBACCO PRODUCTS 24 HOURS BEFORE SURGERY AND NO VAPING   If you use CPAP/BiPAP, bring your machine with you on day of surgery if you will be staying overnight.  For those patients having same-day surgery:   Arrange for transportation home with a responsible adult who will remain with you for 24 hours after your procedure. The adult must be present with you prior to the procedure. You will not be allowed to proceed with the planned surgery if you are not accompanied by an appropriate post-procedure caregiver.   When you are able to tolerate fluids, urinate, etc, you will receive discharge instructions for your care at home (both verbal and written).   After completing all of the above, you will be discharged to home with instructions to follow-up   If you have any additional questions, please contact your surgeon's office or the Pre-Admission Testing Nurse at [phone].   Please follow any specific instructions from you surgeon's office concerning aspirin/ other blood thinners, physical therapy, or bowel preps/enemas, etc.    SIGNATURE: ___________________________________________________.    DATE:  ___________________________________________________.

## 2022-07-04 ENCOUNTER — Other Ambulatory Visit: Payer: Self-pay

## 2022-07-04 ENCOUNTER — Other Ambulatory Visit (HOSPITAL_COMMUNITY): Payer: Self-pay | Admitting: Urology

## 2022-07-04 MED ORDER — HYDROCODONE 10 MG-ACETAMINOPHEN 325 MG TABLET
1.0000 | ORAL_TABLET | Freq: Two times a day (BID) | ORAL | 0 refills | Status: AC
Start: 2022-07-04 — End: ?

## 2022-07-04 NOTE — Addendum Note (Signed)
Addended by: Prince Solian on: 07/04/2022 11:18 AM     Modules accepted: Orders

## 2022-07-05 ENCOUNTER — Ambulatory Visit: Payer: Medicare (Managed Care) | Attending: Urology

## 2022-07-05 ENCOUNTER — Ambulatory Visit (HOSPITAL_BASED_OUTPATIENT_CLINIC_OR_DEPARTMENT_OTHER)
Admission: RE | Admit: 2022-07-05 | Discharge: 2022-07-05 | Disposition: A | Discharge status: Home / Self Care | Payer: Medicare (Managed Care) | Source: Ambulatory Visit | Attending: Urology | Admitting: Urology

## 2022-07-05 DIAGNOSIS — Z01818 Encounter for other preprocedural examination: Secondary | ICD-10-CM | POA: Insufficient documentation

## 2022-07-05 DIAGNOSIS — N201 Calculus of ureter: Secondary | ICD-10-CM

## 2022-07-05 LAB — CBC WITH DIFF
BASOPHIL #: <0.1 10*3/uL (ref <=0.20)
BASOPHIL %: 0.6 %
EOSINOPHIL #: <0.1 10*3/uL (ref <=0.50)
EOSINOPHIL %: 1.3 %
HCT: 45.7 % (ref 38.9–52.0)
HGB: 15.3 g/dL (ref 13.4–17.5)
IMMATURE GRANULOCYTE #: <0.1 10*3/uL (ref <0.10)
IMMATURE GRANULOCYTE %: 0.3 % (ref 0.0–1.0)
LYMPHOCYTE #: 1.87 10*3/uL (ref 1.00–4.80)
LYMPHOCYTE %: 29.5 %
MCH: 30.7 pg (ref 26.0–32.0)
MCHC: 33.5 g/dL (ref 31.0–35.5)
MCV: 91.6 fL (ref 78.0–100.0)
MONOCYTE #: 0.57 10*3/uL (ref 0.20–1.10)
MONOCYTE %: 9 %
MPV: 10.9 fL (ref 8.7–12.5)
NEUTROPHIL #: 3.76 10*3/uL (ref 1.50–7.70)
NEUTROPHIL %: 59.3 %
PLATELETS: 199 10*3/uL (ref 150–400)
RBC: 4.99 10*6/uL (ref 4.50–6.10)
RDW-CV: 13.3 % (ref 11.5–15.5)
WBC: 6.3 10*3/uL (ref 3.7–11.0)

## 2022-07-05 LAB — BASIC METABOLIC PANEL
BUN/CREA RATIO: 32
BUN: 26 mg/dL — ABNORMAL HIGH (ref 6–20)
CALCIUM: 9.9 mg/dL (ref 8.3–10.7)
CHLORIDE: 104 mmol/L (ref 96–106)
CO2 TOTAL: 25 mmol/L (ref 22–30)
CREATININE: 0.82 mg/dL (ref 0.70–1.20)
ESTIMATED GFR: >90 mL/min/{1.73_m2} (ref >90)
GLUCOSE: 77 mg/dL (ref 74–109)
POTASSIUM: 4.4 mmol/L (ref 3.2–5.0)
SODIUM: 140 mmol/L (ref 133–144)

## 2022-07-05 LAB — ECG 12 LEAD
Atrial Rate: 67 {beats}/min
Calculated P Axis: 7 degrees
Calculated R Axis: 9 degrees
Calculated T Axis: 31 degrees
PR Interval: 148 ms
QRS Duration: 94 ms
QT Interval: 392 ms
QTC Calculation: 414 ms
Ventricular rate: 67 {beats}/min

## 2022-07-05 NOTE — H&P (Signed)
Addendum to Dr Saia Derossett office note dated Jul 01, 2022 :  General: Mr. Tienken is a 42 year old male that presents as his stated age. He appears well groomed and well nourished. Obese. Alert and oriented to person, place and time.  HEENT: Head is normocephalic. Pupils are equal and round. Patient is blind.Sclerae is white. Tympanic membranes are pearly gray, shiny and intact bilaterally. No drainage is noted in the ear canal. Nares are patent. Septum is midline. Pharynx [place] pink. Mucous membranes are pink and moist with no ulcers or lesions noted. Uvula is midline.Patient wears bilateral hearing aids.  Neck: Supple. Negative lymphadenopathy. Negative thyroid enlargement. Negative for carotid bruits.  Lungs: Clear to auscultation bilaterally. No adventitious sounds are noted. Respirations are even and unlabored.  Cardiovascular: Regular rate and rhythm. No murmur, gallops, click or rubs are noted. No edema noted. There are +2 peripheral pulses noted x2.  Abdomen: Soft and non-distended. Positive bowel sounds x4 quadrants. No masses, lumps or rebound tenderness is noted.   Breast/Genitalia/Rectal: Deferred to private physician.  Musculoskeletal: No range of motion deficits or abnormalities of the upper or lower extremities noted. No muscle weakness noted.  Neurologic: Gait is steady. Cranial nerves II-XII grossly intact. No loss of sensation noted.  Peripheral vascular: No edema noted of the lower extremities bilaterally. +2 peripheral pulses are noted x2.  Skin:Good color and turgor noted. No rashes, lesions or ulcers noted.    Caren Hazy, APRN,FNP-BC

## 2022-07-09 ENCOUNTER — Other Ambulatory Visit: Payer: Self-pay

## 2022-07-09 ENCOUNTER — Inpatient Hospital Stay
Admission: RE | Admit: 2022-07-09 | Discharge: 2022-07-09 | Disposition: A | Discharge status: Home / Self Care | Payer: Medicare (Managed Care) | Source: Ambulatory Visit | Attending: Urology | Admitting: Urology

## 2022-07-09 DIAGNOSIS — N201 Calculus of ureter: Secondary | ICD-10-CM

## 2022-07-10 ENCOUNTER — Encounter (HOSPITAL_COMMUNITY): Admission: RE | Payer: Self-pay | Source: Ambulatory Visit

## 2022-07-10 ENCOUNTER — Inpatient Hospital Stay (HOSPITAL_COMMUNITY): Admission: RE | Admit: 2022-07-10 | Payer: Medicare (Managed Care) | Source: Ambulatory Visit | Admitting: Urology

## 2022-07-10 HISTORY — DX: Personal history of other diseases of urinary system: Z87.448

## 2022-07-10 HISTORY — DX: Essential (primary) hypertension: I10

## 2022-07-10 HISTORY — DX: Gastro-esophageal reflux disease without esophagitis: K21.9

## 2022-07-10 HISTORY — DX: Presence of spectacles and contact lenses: Z97.3

## 2022-07-10 HISTORY — DX: Unspecified visual loss: H54.7

## 2022-07-10 HISTORY — DX: Personal history of other diseases of the nervous system and sense organs: Z86.69

## 2022-07-10 SURGERY — URETEROSCOPY WITH ELECTRO HYDRALIC LITHOTRIPSY
Anesthesia: General | Laterality: Left

## 2022-12-24 ENCOUNTER — Telehealth (HOSPITAL_BASED_OUTPATIENT_CLINIC_OR_DEPARTMENT_OTHER): Payer: Self-pay | Admitting: OTOLARYNGOLOGY

## 2022-12-24 NOTE — Telephone Encounter (Signed)
Tried to call pt to schedule appt but no answer. Phone kept ringing, couldn't leave a vm

## 2023-03-31 ENCOUNTER — Ambulatory Visit (HOSPITAL_BASED_OUTPATIENT_CLINIC_OR_DEPARTMENT_OTHER): Payer: Self-pay | Admitting: OTOLARYNGOLOGY

## 2023-05-01 ENCOUNTER — Ambulatory Visit (HOSPITAL_BASED_OUTPATIENT_CLINIC_OR_DEPARTMENT_OTHER): Payer: Self-pay | Admitting: OTOLARYNGOLOGY
